# Patient Record
Sex: Female | Born: 1980
Health system: Southern US, Community
[De-identification: ages and names within clinical notes are randomized; demographics above are authoritative.]

## PROBLEM LIST (undated history)

## (undated) DIAGNOSIS — M26629 Arthralgia of temporomandibular joint, unspecified side: Secondary | ICD-10-CM

## (undated) DIAGNOSIS — M79606 Pain in leg, unspecified: Secondary | ICD-10-CM

## (undated) DIAGNOSIS — G8929 Other chronic pain: Secondary | ICD-10-CM

## (undated) DIAGNOSIS — I8390 Asymptomatic varicose veins of unspecified lower extremity: Secondary | ICD-10-CM

## (undated) HISTORY — DX: Asymptomatic varicose veins of unspecified lower extremity: I83.90

## (undated) HISTORY — PX: FINGER SURGERY: SHX640

## (undated) HISTORY — PX: TUBAL LIGATION: SHX77

---
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
## 2001-03-14 ENCOUNTER — Other Ambulatory Visit: Admission: RE | Admit: 2001-03-14 | Discharge: 2001-03-14 | Payer: Self-pay | Admitting: Family Medicine

## 2001-03-17 ENCOUNTER — Encounter: Payer: Self-pay | Admitting: Family Medicine

## 2001-03-17 ENCOUNTER — Inpatient Hospital Stay (HOSPITAL_COMMUNITY): Admission: RE | Admit: 2001-03-17 | Discharge: 2001-03-17 | Payer: Self-pay | Admitting: Family Medicine

## 2001-03-29 ENCOUNTER — Encounter (INDEPENDENT_AMBULATORY_CARE_PROVIDER_SITE_OTHER): Payer: Self-pay

## 2001-03-29 ENCOUNTER — Encounter: Payer: Self-pay | Admitting: Obstetrics

## 2001-03-29 ENCOUNTER — Ambulatory Visit (HOSPITAL_COMMUNITY): Admission: RE | Admit: 2001-03-29 | Discharge: 2001-03-29 | Payer: Self-pay | Admitting: Obstetrics

## 2001-07-20 ENCOUNTER — Encounter: Payer: Self-pay | Admitting: Family Medicine

## 2001-07-20 ENCOUNTER — Ambulatory Visit (HOSPITAL_COMMUNITY): Admission: RE | Admit: 2001-07-20 | Discharge: 2001-07-20 | Payer: Self-pay | Admitting: Family Medicine

## 2001-10-06 ENCOUNTER — Ambulatory Visit (HOSPITAL_COMMUNITY): Admission: RE | Admit: 2001-10-06 | Discharge: 2001-10-06 | Payer: Self-pay | Admitting: Family Medicine

## 2001-10-06 ENCOUNTER — Encounter: Payer: Self-pay | Admitting: Family Medicine

## 2001-11-15 ENCOUNTER — Ambulatory Visit (HOSPITAL_COMMUNITY): Admission: RE | Admit: 2001-11-15 | Discharge: 2001-11-15 | Payer: Self-pay | Admitting: Family Medicine

## 2001-11-15 ENCOUNTER — Encounter: Payer: Self-pay | Admitting: Family Medicine

## 2002-01-05 ENCOUNTER — Ambulatory Visit (HOSPITAL_COMMUNITY): Admission: RE | Admit: 2002-01-05 | Discharge: 2002-01-05 | Payer: Self-pay | Admitting: *Deleted

## 2002-01-05 ENCOUNTER — Encounter: Payer: Self-pay | Admitting: Family Medicine

## 2002-03-02 ENCOUNTER — Inpatient Hospital Stay (HOSPITAL_COMMUNITY): Admission: AD | Admit: 2002-03-02 | Discharge: 2002-03-04 | Payer: Self-pay | Admitting: Family Medicine

## 2002-04-17 ENCOUNTER — Other Ambulatory Visit: Admission: RE | Admit: 2002-04-17 | Discharge: 2002-04-17 | Payer: Self-pay | Admitting: Family Medicine

## 2002-04-23 ENCOUNTER — Inpatient Hospital Stay (HOSPITAL_COMMUNITY): Admission: AD | Admit: 2002-04-23 | Discharge: 2002-04-23 | Payer: Self-pay | Admitting: Family Medicine

## 2003-08-29 ENCOUNTER — Other Ambulatory Visit: Admission: RE | Admit: 2003-08-29 | Discharge: 2003-08-29 | Payer: Self-pay | Admitting: Family Medicine

## 2003-08-30 ENCOUNTER — Ambulatory Visit (HOSPITAL_COMMUNITY): Admission: RE | Admit: 2003-08-30 | Discharge: 2003-08-30 | Payer: Self-pay | Admitting: Family Medicine

## 2003-10-11 ENCOUNTER — Ambulatory Visit (HOSPITAL_COMMUNITY): Admission: RE | Admit: 2003-10-11 | Discharge: 2003-10-11 | Payer: Self-pay | Admitting: Family Medicine

## 2004-03-05 ENCOUNTER — Inpatient Hospital Stay (HOSPITAL_COMMUNITY): Admission: AD | Admit: 2004-03-05 | Discharge: 2004-03-06 | Payer: Self-pay | Admitting: Family Medicine

## 2004-04-17 ENCOUNTER — Ambulatory Visit (HOSPITAL_COMMUNITY): Admission: RE | Admit: 2004-04-17 | Discharge: 2004-04-17 | Payer: Self-pay | Admitting: Obstetrics & Gynecology

## 2004-04-17 ENCOUNTER — Ambulatory Visit: Payer: Self-pay | Admitting: Obstetrics & Gynecology

## 2004-04-28 ENCOUNTER — Ambulatory Visit: Payer: Self-pay | Admitting: Obstetrics & Gynecology

## 2007-12-20 ENCOUNTER — Emergency Department (HOSPITAL_COMMUNITY): Admission: EM | Admit: 2007-12-20 | Discharge: 2007-12-20 | Payer: Self-pay | Admitting: Emergency Medicine

## 2010-05-03 ENCOUNTER — Encounter: Payer: Self-pay | Admitting: Family Medicine

## 2010-08-28 NOTE — Op Note (Signed)
NAME:  Kristi Holmes, Kristi Holmes            ACCOUNT NO.:  [id number]   MEDICAL RECORD NO.:  [id number]          PATIENT TYPE:  AMB   LOCATION:  SDC                           FACILITY:  [facility]   PHYSICIAN:  Lesly Dukes, M.D. DATE OF BIRTH:  05-26-1980   DATE OF PROCEDURE:  04/17/2004  DATE OF DISCHARGE:                                 OPERATIVE REPORT   PREOPERATIVE DIAGNOSIS:  The patient is a 30 year old para 2-0-0-2 female  who presents for permanent sterilization.   POSTOPERATIVE DIAGNOSIS:  The patient is a 30 year old para 2-0-0-2 female  who presents for permanent sterilization.   PROCEDURE:  Laparoscopic bilateral tubal ligation with Filshie clip.   SURGEON:  Lesly Dukes, M.D.   ANESTHESIA:  General.   ESTIMATED BLOOD LOSS:  50 mL.   COMPLICATIONS:  None.   PATHOLOGY:  None.   FINDINGS:  Normal size uterus, normal fallopian tubes and ovaries grossly.   After informed consent was obtained, the patient was taken to the operating  room, where general anesthesia was induced.  The patient was placed in the  dorsal lithotomy position and prepared and draped in the normal sterile  fashion.  A bivalve speculum was placed into the patient's vagina and the  anterior lip of the cervix was grasped with a single-tooth tenaculum.  A  Hulka clip was then introduced into the cervix to aid in uterine  mobilization.  The bladder was in-and-out catheterized.  Gloves were changed  and an infraumbilical skin incision was made with a scalpel and carried down  to the underlying layer of fascia.  The fascia was incised in the  midline  and extended both superiorly and inferiorly.  The peritoneum was identified  and entered bluntly.  Retractors were placed in the abdomen and a blunt  trocar was placed into the incision.  A pneumoperitoneum was achieved with  CO2 and the laparoscope was introduced to the abdomen, where a survey of the  abdominal contents was performed.  In order to facilitate  getting the tubes  properly visualized, a suprapubic incision was made approximately 2 cm above  the pubic symphysis in the midline with a scalpel.  A 5 mm trocar was  introduced into the abdomen under direct visualization.  A blunt trocar was  then introduced into the abdomen and the fallopian tubes were brought into  view.  Filshie clips were placed on the right and left fallopian tube and  placement was confirmed.  There was no bleeding.  Pictures were taken  through the laparoscope.  The pneumoperitoneum was released and the trocars  removed from the abdomen.  The fascia was closed with 0 Vicryl.  The  infraumbilical skin incision was closed with 4-0 Vicryl in a  subcuticular fashion and the suprapubic incision was closed with Dermabond.  The patient tolerated the procedure well.  All instruments were removed from  the patient's vagina.  Sponge, lap, instrument, and needle count were  correct x2, and the patient went to the recovery room in stable condition.      KHL/MEDQ  D:  04/17/2004  T:  04/17/2004  Job:  [id number]

## 2010-08-28 NOTE — Op Note (Signed)
[facility]  Patient:    Kristi Holmes, Kristi Holmes Visit Number: [id number] MRN: [id number]          Service Type: DSU Location: [facility] Attending Physician:  Tammi Sou Dictated by:   Bing Neighbors Clearance Coots, M.D. Proc. Date: 03/29/01 Admit Date:  03/29/2001 Discharge Date: 03/29/2001                             Operative Report  PREOPERATIVE DIAGNOSES:       An 8 week fetal demise.  POSTOPERATIVE DIAGNOSES:      An 8 week fetal demise.  PROCEDURE:                    Suction dilatation and evacuation.  SURGEON:                      Charles A. Clearance Coots, M.D.  ANESTHESIA:                   MAC with paracervical block.  ESTIMATED BLOOD LOSS:         100 ml.  COMPLICATIONS:                None.  SPECIMEN:                     Products of conception.  OPERATION:                    Patient was brought to the operating room and after satisfactory IV sedation legs were brought up in stirrups and the vagina was prepped and draped in the usual sterile fashion.  The urinary bladder was emptied of approximately 50 ml of clear urine.  Bimanual examination revealed the uterus to be 8-9 weeks size, mid position.  Sterile speculum was inserted in the vagina and cervix was isolated.  The anterior lip of the cervix was grasped with a single tooth tenaculum.  The uterus was then sounded for access confirmation with uterine sound and cervix was dilated to #[facility] dilator. A #8 suction catheter was then easily introduced into the uterine cavity and all contents were evacuated.  The endometrial surface was then curetted with a small sharp curette and no further products of conception were obtained. There was no active bleeding at the conclusion of the procedure.  The uterus contracted down quite well.  All instruments were retired.  The patient tolerated the procedure well and was transported to the recovery room in satisfactory condition. Dictated by:    Bing Neighbors Clearance Coots, M.D. Attending Physician:  Tammi Sou DD:  04/07/01 TD:  04/07/01 Job: [id number]

## 2011-01-13 LAB — URINALYSIS, ROUTINE W REFLEX MICROSCOPIC
Bilirubin Urine: NEGATIVE
Specific Gravity, Urine: 1.035 — ABNORMAL HIGH
Urobilinogen, UA: 0.2

## 2011-01-13 LAB — POCT PREGNANCY, URINE: Preg Test, Ur: NEGATIVE

## 2011-01-13 LAB — WET PREP, GENITAL
Trich, Wet Prep: NONE SEEN
Yeast Wet Prep HPF POC: NONE SEEN

## 2011-05-17 ENCOUNTER — Emergency Department (HOSPITAL_COMMUNITY): Payer: BC Managed Care – PPO

## 2011-05-17 ENCOUNTER — Emergency Department (HOSPITAL_COMMUNITY)
Admission: EM | Admit: 2011-05-17 | Discharge: 2011-05-17 | Disposition: A | Discharge status: Home / Self Care | Payer: BC Managed Care – PPO | Attending: Emergency Medicine | Admitting: Emergency Medicine

## 2011-05-17 ENCOUNTER — Encounter (HOSPITAL_COMMUNITY): Payer: Self-pay

## 2011-05-17 DIAGNOSIS — S61209A Unspecified open wound of unspecified finger without damage to nail, initial encounter: Secondary | ICD-10-CM | POA: Insufficient documentation

## 2011-05-17 DIAGNOSIS — IMO0002 Reserved for concepts with insufficient information to code with codable children: Secondary | ICD-10-CM

## 2011-05-17 DIAGNOSIS — S62639B Displaced fracture of distal phalanx of unspecified finger, initial encounter for open fracture: Secondary | ICD-10-CM | POA: Insufficient documentation

## 2011-05-17 DIAGNOSIS — W230XXA Caught, crushed, jammed, or pinched between moving objects, initial encounter: Secondary | ICD-10-CM | POA: Insufficient documentation

## 2011-05-17 DIAGNOSIS — M79609 Pain in unspecified limb: Secondary | ICD-10-CM | POA: Insufficient documentation

## 2011-05-17 MED ORDER — CEFAZOLIN SODIUM 1-5 GM-% IV SOLN
1.0000 g | Freq: Once | INTRAVENOUS | Status: AC
  Administered 2011-05-17: 1 g via INTRAVENOUS
  Filled 2011-05-17: qty 50

## 2011-05-17 MED ORDER — OXYCODONE-ACETAMINOPHEN 5-325 MG PO TABS: 2.0000 | ORAL_TABLET | ORAL | Status: AC | PRN

## 2011-05-17 MED ORDER — HYDROMORPHONE HCL PF 1 MG/ML IJ SOLN
1.0000 mg | Freq: Once | INTRAMUSCULAR | Status: AC
  Administered 2011-05-17: 1 mg via INTRAVENOUS
  Filled 2011-05-17: qty 1

## 2011-05-17 MED ORDER — TETANUS-DIPHTH-ACELL PERTUSSIS 5-2.5-18.5 LF-MCG/0.5 IM SUSP
0.5000 mL | Freq: Once | INTRAMUSCULAR | Status: AC
  Administered 2011-05-17: 0.5 mL via INTRAMUSCULAR
  Filled 2011-05-17: qty 0.5

## 2011-05-17 MED ORDER — OXYCODONE-ACETAMINOPHEN 5-325 MG PO TABS
1.0000 | ORAL_TABLET | Freq: Once | ORAL | Status: AC
  Administered 2011-05-17: 1 via ORAL
  Filled 2011-05-17: qty 1

## 2011-05-17 NOTE — Progress Notes (Signed)
Orthopedic Tech Progress Note Patient Details:  Kristi Holmes [birth year] [id number]  Type of Splint: Finger Splint Location: (L) UE Splint Interventions: Application    Jennye Moccasin 05/17/2011, 6:42 PM

## 2011-05-17 NOTE — ED Notes (Signed)
Per PA wrapped pt's finger (laceration) with a saline dressing

## 2011-05-17 NOTE — ED Provider Notes (Signed)
History     CSN: [id number]  Arrival date & time 05/17/11  1420   None     Chief Complaint  Patient presents with  . Finger Injury    (Consider location/radiation/quality/duration/timing/severity/associated sxs/prior treatment) HPI History provided by pt.   Pt jammed her left middle finger between a log and the top of a wood-burning stove just pta.  C/o severe, non-radiating, throbbing pain.  No associated paresthesias.  She has not cleaned the wound.  Last tetanus unknown.    History reviewed. No pertinent past medical history.  History reviewed. No pertinent past surgical history.  History reviewed. No pertinent family history.  History  Substance Use Topics  . Smoking status: Never Smoker   . Smokeless tobacco: Not on file  . Alcohol Use: No    OB History    Grav Para Term Preterm Abortions TAB SAB Ect Mult Living                  Review of Systems  All other systems reviewed and are negative.    Allergies  Review of patient's allergies indicates no known allergies.  Home Medications   Current Outpatient Rx  Name Route Sig Dispense Refill  . OXYCODONE-ACETAMINOPHEN 5-325 MG PO TABS Oral Take 2 tablets by mouth every 4 (four) hours as needed for pain. 20 tablet 0    BP 128/81  Pulse 68  Temp(Src) 97.9 F (36.6 C) (Oral)  Resp 18  SpO2 99%  LMP 05/10/2011  Physical Exam  Nursing note and vitals reviewed. Constitutional: She is oriented to person, place, and time. She appears well-developed and well-nourished. No distress.  HENT:  Head: Normocephalic and atraumatic.  Eyes:       Normal appearance  Neck: Normal range of motion.  Musculoskeletal:       Left middle finger w/ 2.5cm horizontal laceration of anterior surface of distal phalanx, through the proximal nail bed. Oozing blood.  Distal sensation intact.    Neurological: She is alert and oriented to person, place, and time.  Psychiatric: She has a normal mood and affect. Her behavior is normal.     ED Course  Procedures (including critical care time)  LACERATION REPAIR Performed by: Otilio Miu Authorized by: Otilio Miu Consent: Verbal consent obtained. Risks and benefits: risks, benefits and alternatives were discussed Consent given by: patient Patient identity confirmed: provided demographic data Prepped and Draped in normal sterile fashion Wound explored  Laceration Location: distal phalanx left middle finger  Laceration Length: 2.5cm  No Foreign Bodies seen or palpated  Anesthesia: digital block  Local anesthetic: lidocaine 2% w/out epinephrine  Anesthetic total: 8 ml  (pt had to be injected twice because medication wore off)  Irrigation method: syringe Amount of cleaning: standard  Skin closure: Three 5.0 prolene and five 4.0 prolene  Technique: simple interrupted  Patient tolerance: Patient tolerated the procedure well with no immediate complications.  Labs Reviewed - No data to display Dg Finger Middle Left  05/17/2011  *RADIOLOGY REPORT*  Clinical Data: Injury.  Pain.  LEFT MIDDLE FINGER 2+V  Comparison: None.  Findings: There is a comminuted fracture of the tuft of the distal phalanx of the left third finger present.  There is a dressing overlying the fracture.  The tuft of the distal phalanx is displaced 4 mm in a volar direction. There is no significant angulation.  IMPRESSION: Comminuted displaced fracture of the tuft of the distal phalanx left third finger.  Original Report Authenticated By: Rolla Plate, M.D.  1. Open fracture of distal phalangeal tuft       MDM  Pt presents w/ open tuft fracture of left third finger.  Wound cleaned and sutured.  Pt received 1g IV Ancef and a tetanus.  Dr. Mina Marble consulted and he will f/u with patient this week.   Pt d/c'd home w/ prescription for pain medication.  Flow manager called in her prescription for keflex Tuesday morning and pt notified.        Arie Sabina  [place], [place] 05/18/11 1128

## 2011-05-17 NOTE — ED Notes (Signed)
Smashed middle finger on left hand loading wood stove, occure dpta, pms intact to tip of finger. Lac ntoed.

## 2011-05-18 NOTE — ED Notes (Signed)
Ruby Cola PA called and asked FM to notify pt Abx Rx for "Keflex 500 mg tab, 1 tab 4 times/day x 7 days" not given 05/17/11.  Pt notified of need for Abx Rx and Rx called to [facility].

## 2011-05-20 NOTE — ED Provider Notes (Signed)
Medical screening examination/treatment/procedure(s) were performed by non-physician practitioner and as supervising physician I was immediately available for consultation/collaboration.   Celene Kras, MD 05/20/11 [phone]

## 2013-05-01 ENCOUNTER — Other Ambulatory Visit: Payer: Self-pay | Admitting: General Practice

## 2013-05-14 ENCOUNTER — Encounter (INDEPENDENT_AMBULATORY_CARE_PROVIDER_SITE_OTHER): Payer: Self-pay

## 2013-05-14 ENCOUNTER — Encounter: Payer: Self-pay | Admitting: General Practice

## 2013-05-14 ENCOUNTER — Ambulatory Visit (INDEPENDENT_AMBULATORY_CARE_PROVIDER_SITE_OTHER): Payer: BC Managed Care – PPO | Admitting: General Practice

## 2013-05-14 VITALS — BP 139/90 | HR 77 | Temp 98.1°F | Ht 69.0 in | Wt 148.0 lb

## 2013-05-14 DIAGNOSIS — Z01419 Encounter for gynecological examination (general) (routine) without abnormal findings: Secondary | ICD-10-CM

## 2013-05-14 DIAGNOSIS — Z124 Encounter for screening for malignant neoplasm of cervix: Secondary | ICD-10-CM

## 2013-05-14 DIAGNOSIS — R634 Abnormal weight loss: Secondary | ICD-10-CM

## 2013-05-14 DIAGNOSIS — R5381 Other malaise: Secondary | ICD-10-CM

## 2013-05-14 DIAGNOSIS — S0300XA Dislocation of jaw, unspecified side, initial encounter: Secondary | ICD-10-CM | POA: Insufficient documentation

## 2013-05-14 DIAGNOSIS — R5383 Other fatigue: Secondary | ICD-10-CM

## 2013-05-14 LAB — POCT UA - MICROSCOPIC ONLY
CASTS, UR, LPF, POC: NEGATIVE
Crystals, Ur, HPF, POC: NEGATIVE
Mucus, UA: NEGATIVE
YEAST UA: NEGATIVE

## 2013-05-14 LAB — POCT URINALYSIS DIPSTICK
BILIRUBIN UA: NEGATIVE
Glucose, UA: NEGATIVE
KETONES UA: NEGATIVE
Leukocytes, UA: NEGATIVE
Nitrite, UA: NEGATIVE
PH UA: 7
RBC UA: NEGATIVE
Spec Grav, UA: 1.01
Urobilinogen, UA: NEGATIVE

## 2013-05-14 MED ORDER — PREDNISONE 20 MG PO TABS: 40.0000 mg | ORAL_TABLET | Freq: Every day | ORAL | Status: DC

## 2013-05-14 MED ORDER — PREDNISONE (PAK) 10 MG PO TABS: ORAL_TABLET | ORAL | Status: DC

## 2013-05-14 MED ORDER — METHYLPREDNISOLONE ACETATE 80 MG/ML IJ SUSP
80.0000 mg | Freq: Once | INTRAMUSCULAR | Status: AC
  Administered 2013-05-14: 80 mg via INTRAMUSCULAR

## 2013-05-14 NOTE — Patient Instructions (Signed)
Temporomandibular Problems  Temporomandibular joint (TMJ) dysfunction means there are problems with the joint between your jaw and your skull. This is a joint lined by cartilage like other joints in your body but also has a small disc in the joint which keeps the bones from rubbing on each other. These joints are like other joints and can get inflamed (sore) from arthritis and other problems. When this joint gets sore, it can cause headaches and pain in the jaw and the face. CAUSES  Usually the arthritic types of problems are caused by soreness in the joint. Soreness in the joint can also be caused by overuse. This may come from grinding your teeth. It may also come from mis-alignment in the joint. DIAGNOSIS Diagnosis of this condition can often be made by history and exam. Sometimes your caregiver may need X-rays or an MRI scan to determine the exact cause. It may be necessary to see your dentist to determine if your teeth and jaws are lined up correctly. TREATMENT  Most of the time this problem is not serious; however, sometimes it can persist (become chronic). When this happens medications that will cut down on inflammation (soreness) help. Sometimes a shot of cortisone into the joint will be helpful. If your teeth are not aligned it may help for your dentist to make a splint for your mouth that can help this problem. If no physical problems can be found, the problem may come from tension. If tension is found to be the cause, biofeedback or relaxation techniques may be helpful. HOME CARE INSTRUCTIONS   Later in the day, applications of ice packs may be helpful. Ice can be used in a plastic bag with a towel around it to prevent frostbite to skin. This may be used about every 2 hours for 20 to 30 minutes, as needed while awake, or as directed by your caregiver.  Only take over-the-counter or prescription medicines for pain, discomfort, or fever as directed by your caregiver.  If physical therapy was  prescribed, follow your caregiver's directions.  Wear mouth appliances as directed if they were given. Document Released: 12/22/2000 Document Revised: 06/21/2011 Document Reviewed: 03/31/2008 [employer] Patient Information 2014 [employer].  

## 2013-05-14 NOTE — Progress Notes (Signed)
Subjective:    Patient ID: Kristi Holmes, female    DOB: 11/28/1980, 33 y.o.   MRN: [id number]  HPI Patient presents today for GYN exam. She complains of TMJ pain, weight loss, and feeling fatigued. She reports being seen by her dentist and he suggested an ENT, but she is unable to afford visiting a specialist. Patient has flexeril that she is reportedly taking at night to help with TMJ pain. Also taking ibuprofen OTC. Reports minimal relief. Has taken prednisone in the past and significant relief provided.     Review of Systems  Constitutional: Positive for fatigue. Negative for fever and chills.  HENT:       Jaw pain  Respiratory: Negative for chest tightness, shortness of breath and wheezing.   Cardiovascular: Negative for chest pain and palpitations.  Gastrointestinal: Negative for nausea, vomiting, abdominal pain, diarrhea, constipation and blood in stool.  Genitourinary: Negative for dysuria and difficulty urinating.  Neurological: Negative for dizziness, weakness and headaches.       Objective:   Physical Exam  Constitutional: She is oriented to person, place, and time. She appears well-developed and well-nourished.  HENT:  Head: Normocephalic.  Right Ear: External ear normal.  Left Ear: External ear normal.  Mouth/Throat: Oropharynx is clear and moist.  Mandibular pain with opening and closing mouth  Eyes: Conjunctivae and EOM are normal. Pupils are equal, round, and reactive to light.  Neck: Normal range of motion. Neck supple. No thyromegaly present.  Cardiovascular: Normal rate, regular rhythm, normal heart sounds and intact distal pulses.   Pulmonary/Chest: Effort normal and breath sounds normal. No respiratory distress. She exhibits no tenderness. Right breast exhibits no inverted nipple, no mass, no nipple discharge, no skin change and no tenderness. Left breast exhibits no inverted nipple, no mass, no nipple discharge, no skin change and no tenderness. Breasts  are symmetrical.  Abdominal: Soft. Bowel sounds are normal. She exhibits no distension.  Genitourinary: Vagina normal and uterus normal. No breast swelling, tenderness, discharge or bleeding. No labial fusion. There is no rash, tenderness, lesion or injury on the right labia. There is no rash, tenderness, lesion or injury on the left labia. Uterus is not deviated, not enlarged, not fixed and not tender. Cervix exhibits no motion tenderness, no discharge and no friability. Right adnexum displays no mass, no tenderness and no fullness. Left adnexum displays no mass, no tenderness and no fullness. No erythema, tenderness or bleeding around the vagina. No foreign body around the vagina. No signs of injury around the vagina. No vaginal discharge found.  Lymphadenopathy:    She has no cervical adenopathy.  Neurological: She is alert and oriented to person, place, and time.  Skin: Skin is warm and dry.  Psychiatric: She has a normal mood and affect.          Assessment & Plan:  1. Encounter for routine gynecological examination  - POCT urinalysis dipstick - POCT UA - Microscopic Only - Pap IG w/ reflex to HPV when ASC-U  2. Fatigue  - POCT CBC - CMP14+EGFR  3. Loss of weight  - Thyroid Panel With TSH  4. TMJ (dislocation of temporomandibular joint)  - methylPREDNISolone acetate (DEPO-MEDROL) injection 80 mg; Inject 1 mL (80 mg total) into the muscle once. - predniSONE (STERAPRED UNI-PAK) 10 MG tablet; Take as directed  Dispense: 21 tablet; Refill: 0 -ice affected area 15-20 minutes increments -patient declined naproxen at this time -declined ENT referral at this time -RTO prn Patient verbalized understanding  Marieanne Marxen E. Dallas Scorsone, FNP-C

## 2013-05-15 ENCOUNTER — Other Ambulatory Visit: Payer: Self-pay | Admitting: General Practice

## 2013-05-15 DIAGNOSIS — S0300XA Dislocation of jaw, unspecified side, initial encounter: Secondary | ICD-10-CM

## 2013-05-15 LAB — CMP14+EGFR
ALK PHOS: 35 IU/L — AB (ref 39–117)
ALT: 9 IU/L (ref 0–32)
AST: 12 IU/L (ref 0–40)
Albumin/Globulin Ratio: 2.5 (ref 1.1–2.5)
Albumin: 4.2 g/dL (ref 3.5–5.5)
BUN / CREAT RATIO: 17 (ref 8–20)
BUN: 12 mg/dL (ref 6–20)
CALCIUM: 8.8 mg/dL (ref 8.7–10.2)
CHLORIDE: 101 mmol/L (ref 97–108)
CO2: 24 mmol/L (ref 18–29)
Creatinine, Ser: 0.69 mg/dL (ref 0.57–1.00)
GFR calc Af Amer: 133 mL/min/{1.73_m2} (ref >59)
GFR calc non Af Amer: 116 mL/min/{1.73_m2} (ref >59)
Globulin, Total: 1.7 g/dL (ref 1.5–4.5)
Glucose: 93 mg/dL (ref 65–99)
Potassium: 4.2 mmol/L (ref 3.5–5.2)
SODIUM: 140 mmol/L (ref 134–144)
TOTAL PROTEIN: 5.9 g/dL — AB (ref 6.0–8.5)
Total Bilirubin: <0.2 mg/dL (ref 0.0–1.2)

## 2013-05-15 LAB — PAP IG W/ RFLX HPV ASCU: PAP SMEAR COMMENT: 0

## 2013-05-15 LAB — THYROID PANEL WITH TSH
FREE THYROXINE INDEX: 1.8 (ref 1.2–4.9)
T3 UPTAKE RATIO: 35 % (ref 24–39)
T4, Total: 5.1 ug/dL (ref 4.5–12.0)
TSH: 0.992 u[IU]/mL (ref 0.450–4.500)

## 2013-05-22 ENCOUNTER — Telehealth: Payer: Self-pay | Admitting: *Deleted

## 2013-05-22 NOTE — Telephone Encounter (Signed)
Message copied by Tamera PuntWRAY, Heavyn Yearsley S on Tue May 22, 2013 11:29 AM ------      Message from: Philomena DohenyHALIBURTON, MAE E      Created: Mon May 21, 2013  8:21 AM       Please inform labs wnl. ------

## 2013-07-02 ENCOUNTER — Ambulatory Visit (INDEPENDENT_AMBULATORY_CARE_PROVIDER_SITE_OTHER): Payer: BC Managed Care – PPO | Admitting: Nurse Practitioner

## 2013-07-02 ENCOUNTER — Encounter: Payer: Self-pay | Admitting: Nurse Practitioner

## 2013-07-02 VITALS — BP 114/61 | HR 60 | Temp 97.8°F | Ht 69.0 in | Wt 143.0 lb

## 2013-07-02 DIAGNOSIS — F411 Generalized anxiety disorder: Secondary | ICD-10-CM

## 2013-07-02 DIAGNOSIS — S0300XA Dislocation of jaw, unspecified side, initial encounter: Secondary | ICD-10-CM

## 2013-07-02 MED ORDER — CITALOPRAM HYDROBROMIDE 20 MG PO TABS: 20.0000 mg | ORAL_TABLET | Freq: Every day | ORAL | Status: DC

## 2013-07-02 MED ORDER — DIAZEPAM 10 MG PO TABS: 10.0000 mg | ORAL_TABLET | Freq: Four times a day (QID) | ORAL | Status: DC | PRN

## 2013-07-02 NOTE — Patient Instructions (Signed)
Temporomandibular Problems  Temporomandibular joint (TMJ) dysfunction means there are problems with the joint between your jaw and your skull. This is a joint lined by cartilage like other joints in your body but also has a small disc in the joint which keeps the bones from rubbing on each other. These joints are like other joints and can get inflamed (sore) from arthritis and other problems. When this joint gets sore, it can cause headaches and pain in the jaw and the face. CAUSES  Usually the arthritic types of problems are caused by soreness in the joint. Soreness in the joint can also be caused by overuse. This may come from grinding your teeth. It may also come from mis-alignment in the joint. DIAGNOSIS Diagnosis of this condition can often be made by history and exam. Sometimes your caregiver may need X-rays or an MRI scan to determine the exact cause. It may be necessary to see your dentist to determine if your teeth and jaws are lined up correctly. TREATMENT  Most of the time this problem is not serious; however, sometimes it can persist (become chronic). When this happens medications that will cut down on inflammation (soreness) help. Sometimes a shot of cortisone into the joint will be helpful. If your teeth are not aligned it may help for your dentist to make a splint for your mouth that can help this problem. If no physical problems can be found, the problem may come from tension. If tension is found to be the cause, biofeedback or relaxation techniques may be helpful. HOME CARE INSTRUCTIONS   Later in the day, applications of ice packs may be helpful. Ice can be used in a plastic bag with a towel around it to prevent frostbite to skin. This may be used about every 2 hours for 20 to 30 minutes, as needed while awake, or as directed by your caregiver.  Only take over-the-counter or prescription medicines for pain, discomfort, or fever as directed by your caregiver.  If physical therapy was  prescribed, follow your caregiver's directions.  Wear mouth appliances as directed if they were given. Document Released: 12/22/2000 Document Revised: 06/21/2011 Document Reviewed: 03/31/2008 [employer] Patient Information 2014 [employer].  

## 2013-07-02 NOTE — Progress Notes (Signed)
   Subjective:    Patient ID: Kristi NicelySamantha Y Holmes, female    DOB: 03/30/1981, 33 y.o.   MRN: [id number]  HPI Patient was seen by M.Haliburton over a month ago and she was not pleased with her care- SHe has the following complaints; -TMJ worsening- really clenching her jaws tightly at night and it keeps her up at night and makes her jaws ache all day- She is currently only taking ibuprofen which doesn't help. Steroid shot shelp temporarily. - GAD- not on anything- lots going on in personal life    Review of Systems  Constitutional: Negative.   HENT: Negative.   Respiratory: Negative.   Cardiovascular: Negative.   Gastrointestinal: Negative.   Psychiatric/Behavioral: Negative.   All other systems reviewed and are negative.       Objective:   Physical Exam  Constitutional: She is oriented to person, place, and time. She appears well-developed and well-nourished.  HENT:  Right Ear: External ear normal.  Left Ear: External ear normal.  Nose: Nose normal.  Mouth/Throat: Oropharynx is clear and moist.  Limited ROM of jaw due to pain- cannot fully open her mouth.  Cardiovascular: Normal rate, regular rhythm and normal heart sounds.   Pulmonary/Chest: Effort normal and breath sounds normal.  Neurological: She is alert and oriented to person, place, and time.  Skin: Skin is warm and dry.  Psychiatric: She has a normal mood and affect. Her behavior is normal. Judgment and thought content normal.   BP 114/61  Pulse 60  Temp(Src) 97.8 F (36.6 C) (Oral)  Ht 5\' 9"  (1.753 m)  Wt 143 lb (64.864 kg)  BMI 21.11 kg/m2        Assessment & Plan:  1. TMJ (dislocation of temporomandibular joint) Mouth guard - diazepam (VALIUM) 10 MG tablet; Take 1 tablet (10 mg total) by mouth every 6 (six) hours as needed for anxiety.  Dispense: 30 tablet; Refill: 1  2. GAD (generalized anxiety disorder) Stress management Side effects discussed Follow up in 1 month - citalopram (CELEXA) 20 MG  tablet; Take 1 tablet (20 mg total) by mouth daily.  Dispense: 30 tablet; Refill: 3  Mary-Margaret Daphine DeutscherMartin, FNP

## 2013-07-10 ENCOUNTER — Telehealth: Payer: Self-pay | Admitting: Nurse Practitioner

## 2013-07-11 NOTE — Telephone Encounter (Signed)
Patient aware has appointment already set up

## 2013-07-11 NOTE — Telephone Encounter (Signed)
NTBS for this.

## 2013-08-01 ENCOUNTER — Encounter: Payer: Self-pay | Admitting: Nurse Practitioner

## 2013-08-01 ENCOUNTER — Ambulatory Visit (INDEPENDENT_AMBULATORY_CARE_PROVIDER_SITE_OTHER): Payer: BC Managed Care – PPO | Admitting: Nurse Practitioner

## 2013-08-01 VITALS — BP 132/84 | HR 72 | Temp 98.1°F | Ht 69.0 in | Wt 142.0 lb

## 2013-08-01 DIAGNOSIS — S0300XA Dislocation of jaw, unspecified side, initial encounter: Secondary | ICD-10-CM

## 2013-08-01 NOTE — Patient Instructions (Signed)
Temporomandibular Problems  Temporomandibular joint (TMJ) dysfunction means there are problems with the joint between your jaw and your skull. This is a joint lined by cartilage like other joints in your body but also has a small disc in the joint which keeps the bones from rubbing on each other. These joints are like other joints and can get inflamed (sore) from arthritis and other problems. When this joint gets sore, it can cause headaches and pain in the jaw and the face. CAUSES  Usually the arthritic types of problems are caused by soreness in the joint. Soreness in the joint can also be caused by overuse. This may come from grinding your teeth. It may also come from mis-alignment in the joint. DIAGNOSIS Diagnosis of this condition can often be made by history and exam. Sometimes your caregiver may need X-rays or an MRI scan to determine the exact cause. It may be necessary to see your dentist to determine if your teeth and jaws are lined up correctly. TREATMENT  Most of the time this problem is not serious; however, sometimes it can persist (become chronic). When this happens medications that will cut down on inflammation (soreness) help. Sometimes a shot of cortisone into the joint will be helpful. If your teeth are not aligned it may help for your dentist to make a splint for your mouth that can help this problem. If no physical problems can be found, the problem may come from tension. If tension is found to be the cause, biofeedback or relaxation techniques may be helpful. HOME CARE INSTRUCTIONS   Later in the day, applications of ice packs may be helpful. Ice can be used in a plastic bag with a towel around it to prevent frostbite to skin. This may be used about every 2 hours for 20 to 30 minutes, as needed while awake, or as directed by your caregiver.  Only take over-the-counter or prescription medicines for pain, discomfort, or fever as directed by your caregiver.  If physical therapy was  prescribed, follow your caregiver's directions.  Wear mouth appliances as directed if they were given. Document Released: 12/22/2000 Document Revised: 06/21/2011 Document Reviewed: 03/31/2008 [employer] Patient Information 2014 [employer].  

## 2013-08-01 NOTE — Progress Notes (Signed)
   Subjective:    Patient ID: Blenda NicelySamantha Y Radcliffe, female    DOB: 1981-02-04, 33 y.o.   MRN: [id number]  HPI Patient was seen March 23,2015 with painful TMJ- was given valium to take at night and use mouth guard at night. She can tell that it is helping but she still has some pain- Chewing some foods don't bother her as much and she asa more ROM. Would still like to see oral surgeon to discuss options.    Review of Systems  Constitutional: Negative.   HENT: Negative.   Respiratory: Negative.   Cardiovascular: Negative.   All other systems reviewed and are negative.      Objective:   Physical Exam  Constitutional: She is oriented to person, place, and time. She appears well-developed and well-nourished.  HENT:  bil jaw popping with opening and closing of mouth- limited rom of jaw due to pain.  Cardiovascular: Normal rate, regular rhythm and normal heart sounds.   Pulmonary/Chest: Effort normal and breath sounds normal.  Neurological: She is alert and oriented to person, place, and time.  Skin: Skin is warm and dry.   BP 132/84  Pulse 72  Temp(Src) 98.1 F (36.7 C) (Oral)  Ht 5\' 9"  (1.753 m)  Wt 142 lb (64.411 kg)  BMI 20.96 kg/m2        Assessment & Plan:  1. TMJ (dislocation of temporomandibular joint) Continue to eat foods that you do not have to chew a lot Continue valium Follow up prn - Ambulatory referral to Oral Maxillofacial Surgery  Mary-Margaret Daphine DeutscherMartin, FNP

## 2013-08-29 ENCOUNTER — Other Ambulatory Visit: Payer: Self-pay | Admitting: Nurse Practitioner

## 2013-08-29 ENCOUNTER — Telehealth: Payer: Self-pay | Admitting: Nurse Practitioner

## 2013-08-29 DIAGNOSIS — S0300XA Dislocation of jaw, unspecified side, initial encounter: Secondary | ICD-10-CM

## 2013-08-29 MED ORDER — DIAZEPAM 10 MG PO TABS: 10.0000 mg | ORAL_TABLET | Freq: Four times a day (QID) | ORAL | Status: DC | PRN

## 2013-08-30 NOTE — Telephone Encounter (Signed)
Patient pick up rx yesterday

## 2013-09-24 ENCOUNTER — Telehealth: Payer: Self-pay | Admitting: Nurse Practitioner

## 2013-09-24 DIAGNOSIS — S0300XA Dislocation of jaw, unspecified side, initial encounter: Secondary | ICD-10-CM

## 2013-09-24 MED ORDER — DIAZEPAM 10 MG PO TABS: 10.0000 mg | ORAL_TABLET | Freq: Four times a day (QID) | ORAL | Status: DC | PRN

## 2013-09-24 NOTE — Telephone Encounter (Signed)
rx ready for pickup 

## 2013-09-24 NOTE — Telephone Encounter (Signed)
Pt notified that rx up front and ready to be picked up

## 2013-10-04 ENCOUNTER — Other Ambulatory Visit: Payer: Self-pay | Admitting: Nurse Practitioner

## 2013-10-04 MED ORDER — TRAMADOL HCL 50 MG PO TABS: 50.0000 mg | ORAL_TABLET | Freq: Four times a day (QID) | ORAL | Status: DC | PRN

## 2013-10-30 ENCOUNTER — Telehealth: Payer: Self-pay | Admitting: Nurse Practitioner

## 2013-10-30 DIAGNOSIS — S0300XS Dislocation of jaw, unspecified side, sequela: Secondary | ICD-10-CM

## 2013-10-30 MED ORDER — DIAZEPAM 10 MG PO TABS: 10.0000 mg | ORAL_TABLET | Freq: Every evening | ORAL | Status: DC | PRN

## 2013-10-30 NOTE — Telephone Encounter (Signed)
Please call in valium 10 mg  1 PO qhs #30 with 0 refills Have not decided what to do about pain meds

## 2013-10-30 NOTE — Telephone Encounter (Signed)
Called in valium and patient aware

## 2013-11-02 ENCOUNTER — Telehealth: Payer: Self-pay | Admitting: Nurse Practitioner

## 2013-11-05 ENCOUNTER — Other Ambulatory Visit: Payer: Self-pay | Admitting: Nurse Practitioner

## 2013-11-05 MED ORDER — TRAMADOL HCL 50 MG PO TABS: 50.0000 mg | ORAL_TABLET | Freq: Four times a day (QID) | ORAL | Status: DC | PRN

## 2013-11-05 NOTE — Telephone Encounter (Signed)
I have discussed wiuth pain management here at office and we are not going o rx that strong of a medication for TMJ. The addictive component is to strong for someone her age. All we can do is valium and or ultram and she will need to see oral surgeon.

## 2013-11-05 NOTE — Telephone Encounter (Signed)
Patient says that is fine and she will take what she can get until she can get an appt with the oral surgeon. Can a rx be done and a referral made. Please advise

## 2013-11-06 ENCOUNTER — Telehealth: Payer: Self-pay | Admitting: Family Medicine

## 2013-11-06 NOTE — Telephone Encounter (Signed)
Patient aware up front to be picked up

## 2013-11-21 ENCOUNTER — Telehealth: Payer: Self-pay | Admitting: Nurse Practitioner

## 2013-11-21 MED ORDER — TRAMADOL HCL 50 MG PO TABS: 50.0000 mg | ORAL_TABLET | Freq: Four times a day (QID) | ORAL | Status: DC | PRN

## 2013-11-21 NOTE — Telephone Encounter (Signed)
rx ready for pick up No more refills for awhile- just had filled on July 27- taking to frequently

## 2013-11-21 NOTE — Telephone Encounter (Signed)
Not due for refill until 11/30/13

## 2013-11-21 NOTE — Telephone Encounter (Signed)
This was prescribed for every 6 hrs PRN #40. This ended up being a 10 day supply for her. She has been out for 2 days.  Do you want to change the directions?

## 2013-11-22 NOTE — Telephone Encounter (Signed)
Up front to pick up 

## 2013-11-28 ENCOUNTER — Telehealth: Payer: Self-pay | Admitting: Nurse Practitioner

## 2013-11-28 DIAGNOSIS — S0300XS Dislocation of jaw, unspecified side, sequela: Secondary | ICD-10-CM

## 2013-11-28 MED ORDER — DIAZEPAM 10 MG PO TABS: 10.0000 mg | ORAL_TABLET | Freq: Every evening | ORAL | Status: DC | PRN

## 2013-11-28 NOTE — Telephone Encounter (Signed)
rx ready for pick up Do Not Call In

## 2013-11-28 NOTE — Telephone Encounter (Signed)
Patient aware is ready to be picked up.  

## 2013-12-03 ENCOUNTER — Telehealth: Payer: Self-pay | Admitting: Nurse Practitioner

## 2013-12-03 MED ORDER — TRAMADOL HCL 50 MG PO TABS: ORAL_TABLET | ORAL | Status: DC

## 2013-12-03 NOTE — Telephone Encounter (Signed)
Last refilled on 11/21/13. She thinks this is a 10 day supply.  Do you want to change the directions so she doesn't think she should take it 4 times a day?

## 2013-12-03 NOTE — Telephone Encounter (Signed)
Do not need to take 4 x a day- should only take if-  Needed rx ready for pick up- note change in directions

## 2013-12-04 NOTE — Telephone Encounter (Signed)
Pt notified about directions change and sent over to discuss referral with debbie for oral surgeon.

## 2014-01-02 ENCOUNTER — Other Ambulatory Visit (HOSPITAL_COMMUNITY): Payer: Self-pay | Admitting: Oral Surgery

## 2014-01-02 ENCOUNTER — Telehealth: Payer: Self-pay | Admitting: Nurse Practitioner

## 2014-01-02 DIAGNOSIS — M26609 Unspecified temporomandibular joint disorder, unspecified side: Secondary | ICD-10-CM

## 2014-01-02 DIAGNOSIS — S0300XS Dislocation of jaw, unspecified side, sequela: Secondary | ICD-10-CM

## 2014-01-02 MED ORDER — DIAZEPAM 10 MG PO TABS: 10.0000 mg | ORAL_TABLET | Freq: Every evening | ORAL | Status: DC | PRN

## 2014-01-02 MED ORDER — TRAMADOL HCL 50 MG PO TABS
ORAL_TABLET | ORAL | Status: DC
Start: 2014-01-02 — End: 2014-01-30

## 2014-01-02 NOTE — Telephone Encounter (Signed)
rx ready for pickup 

## 2014-01-03 NOTE — Telephone Encounter (Signed)
Patient aware to pick up 

## 2014-01-11 ENCOUNTER — Ambulatory Visit (HOSPITAL_COMMUNITY): Admission: RE | Admit: 2014-01-11 | Payer: Medicaid Other | Source: Ambulatory Visit

## 2014-01-21 ENCOUNTER — Ambulatory Visit (HOSPITAL_COMMUNITY)
Admission: RE | Admit: 2014-01-21 | Discharge: 2014-01-21 | Disposition: A | Discharge status: Home / Self Care | Payer: Medicaid Other | Source: Ambulatory Visit | Attending: Oral Surgery | Admitting: Oral Surgery

## 2014-01-21 DIAGNOSIS — M26609 Unspecified temporomandibular joint disorder, unspecified side: Secondary | ICD-10-CM

## 2014-01-21 DIAGNOSIS — M2660 Temporomandibular joint disorder, unspecified: Secondary | ICD-10-CM | POA: Diagnosis present

## 2014-01-29 ENCOUNTER — Telehealth: Payer: Self-pay | Admitting: Nurse Practitioner

## 2014-01-29 DIAGNOSIS — S0300XS Dislocation of jaw, unspecified side, sequela: Secondary | ICD-10-CM

## 2014-01-30 MED ORDER — DIAZEPAM 10 MG PO TABS: 10.0000 mg | ORAL_TABLET | Freq: Every evening | ORAL | Status: DC | PRN

## 2014-01-30 MED ORDER — TRAMADOL HCL 50 MG PO TABS: ORAL_TABLET | ORAL | Status: DC

## 2014-01-30 NOTE — Telephone Encounter (Signed)
Aware, scripts for Valium and Tramadol ready.

## 2014-01-30 NOTE — Telephone Encounter (Signed)
Pt notified to pickup RX. 

## 2014-01-30 NOTE — Telephone Encounter (Signed)
rx ready for pickup 

## 2014-02-14 ENCOUNTER — Telehealth: Payer: Self-pay | Admitting: Nurse Practitioner

## 2014-02-14 NOTE — Telephone Encounter (Signed)
Has to get pain meds from surgeon

## 2014-02-14 NOTE — Telephone Encounter (Signed)
Pt is waiting on surgeon to call her back. She saw him on Tuesday, but woke up several times last night in worse pain. Will call us back if needed, explained that she would have to be seen.

## 2014-02-21 ENCOUNTER — Ambulatory Visit (INDEPENDENT_AMBULATORY_CARE_PROVIDER_SITE_OTHER): Payer: Medicaid Other | Admitting: Family Medicine

## 2014-02-21 ENCOUNTER — Telehealth: Payer: Self-pay | Admitting: Nurse Practitioner

## 2014-02-21 VITALS — BP 136/84 | HR 83 | Temp 97.3°F | Ht 69.0 in | Wt 139.4 lb

## 2014-02-21 DIAGNOSIS — S0300XA Dislocation of jaw, unspecified side, initial encounter: Secondary | ICD-10-CM

## 2014-02-21 DIAGNOSIS — S030XXA Dislocation of jaw, initial encounter: Secondary | ICD-10-CM

## 2014-02-21 MED ORDER — HYDROCODONE-ACETAMINOPHEN 10-325 MG PO TABS: 1.0000 | ORAL_TABLET | Freq: Four times a day (QID) | ORAL | Status: DC | PRN

## 2014-02-21 NOTE — Telephone Encounter (Signed)
Left detailed message on patients voicemail that Kristi Holmes has not appts available today or tomorrow. Will send back and see if meds can be called in

## 2014-02-21 NOTE — Progress Notes (Signed)
   Subjective:    Patient ID: Kristi Holmes, female    DOB: 12-16-1980, 33 y.o.   MRN: [id number]  HPI Patient c/o severe TMJ pain bilateral for over a week.  She states she was seeing her ENT specialist who was prescribing her pain meds and she states he refused to rx and wanted her to see her PCP.  She has had surgery and she is awaiting PT.  Review of Systems    No chest pain, SOB, HA, dizziness, vision change, N/V, diarrhea, constipation, dysuria, urinary urgency or frequency, myalgias, arthralgias or rash.  Objective:    BP 136/84 mmHg  Pulse 83  Temp(Src) 97.3 F (36.3 C)  Ht 5\' 9"  (1.753 m)  Wt 139 lb 6.4 oz (63.231 kg)  BMI 20.58 kg/m2 Physical Exam  TTP bilateral TMJ      Assessment & Plan:     ICD-9-CM ICD-10-CM   1. TMJ (dislocation of temporomandibular joint), initial encounter 830.0 S03.0XXA HYDROcodone-acetaminophen (NORCO) 10-325 MG per tablet     No Follow-up on file.  Deatra CanterWilliam J Starnisha Batrez FNP

## 2014-02-23 ENCOUNTER — Other Ambulatory Visit: Payer: Self-pay | Admitting: Nurse Practitioner

## 2014-02-25 ENCOUNTER — Other Ambulatory Visit: Payer: Self-pay | Admitting: Nurse Practitioner

## 2014-02-25 DIAGNOSIS — S0300XS Dislocation of jaw, unspecified side, sequela: Secondary | ICD-10-CM

## 2014-02-25 MED ORDER — DIAZEPAM 10 MG PO TABS: 10.0000 mg | ORAL_TABLET | Freq: Every evening | ORAL | Status: DC | PRN

## 2014-02-25 NOTE — Telephone Encounter (Signed)
Patient will have to pick up valium rx- cannot fill till 03/01/14 Will not fill ultram because [facility]- will no longer do both

## 2014-02-25 NOTE — Telephone Encounter (Signed)
Aware. 

## 2014-03-01 ENCOUNTER — Other Ambulatory Visit: Payer: Self-pay | Admitting: Nurse Practitioner

## 2014-03-01 DIAGNOSIS — S0300XD Dislocation of jaw, unspecified side, subsequent encounter: Secondary | ICD-10-CM

## 2014-03-01 NOTE — Telephone Encounter (Signed)
Referral made to pain management 

## 2014-03-01 NOTE — Telephone Encounter (Signed)
Advised pt we couldn't refill hydrocodone at this time, pt was upset and states she is in awful pain, she needs something. Advised pt we could do a pain management referral if she wanted. The pt said to do it and then the line disconnected.

## 2014-03-01 NOTE — Telephone Encounter (Signed)
Hydrocodone denied- just had filled on 02/21/14- if going to continue on this will have to be referred to pain management

## 2014-03-01 NOTE — Telephone Encounter (Signed)
I spoke with Annette StableBill and he says no that he will not refill hydrocodone that she just got some on 11/12 and that he agrees that she should be referred to pain management

## 2014-03-01 NOTE — Telephone Encounter (Signed)
Pt ask MMM to refill hydrocodone that [facility] MinorBill Oxford prescribed her on las visit for jaw pain.  MMM is not going to refill.  Pt wants to know if Ander SladeBill Oxford will refill hydrocodone.  Call Kingston[place]antha at [phone]

## 2014-03-02 NOTE — Telephone Encounter (Signed)
Patient aware that Ander SladeBill Oxford will not refill hydrocodone either

## 2014-04-15 ENCOUNTER — Other Ambulatory Visit: Payer: Self-pay | Admitting: Nurse Practitioner

## 2014-04-15 DIAGNOSIS — S0300XS Dislocation of jaw, unspecified side, sequela: Secondary | ICD-10-CM

## 2014-04-16 MED ORDER — DIAZEPAM 10 MG PO TABS
10.0000 mg | ORAL_TABLET | Freq: Every evening | ORAL | Status: DC | PRN
Start: 2014-04-16 — End: 2014-05-15

## 2014-04-16 NOTE — Telephone Encounter (Signed)
Valium rx ready for pick up

## 2014-04-16 NOTE — Telephone Encounter (Signed)
Script ready for patient . 

## 2014-05-02 ENCOUNTER — Ambulatory Visit: Payer: Medicaid Other | Admitting: Nurse Practitioner

## 2014-05-02 ENCOUNTER — Other Ambulatory Visit: Payer: Self-pay | Admitting: Oral Surgery

## 2014-05-02 DIAGNOSIS — G8928 Other chronic postprocedural pain: Secondary | ICD-10-CM

## 2014-05-15 ENCOUNTER — Other Ambulatory Visit: Payer: Self-pay | Admitting: Nurse Practitioner

## 2014-05-15 DIAGNOSIS — S0300XS Dislocation of jaw, unspecified side, sequela: Secondary | ICD-10-CM

## 2014-05-15 MED ORDER — DIAZEPAM 10 MG PO TABS: 10.0000 mg | ORAL_TABLET | Freq: Every evening | ORAL | Status: DC | PRN

## 2014-05-15 NOTE — Telephone Encounter (Signed)
Please call in valium with 0 refills 

## 2014-05-16 NOTE — Telephone Encounter (Signed)
rx called into pharmacy

## 2014-05-20 ENCOUNTER — Other Ambulatory Visit: Payer: Self-pay | Admitting: Oral Surgery

## 2014-05-20 ENCOUNTER — Ambulatory Visit
Admission: RE | Admit: 2014-05-20 | Discharge: 2014-05-20 | Disposition: A | Discharge status: Home / Self Care | Payer: Medicaid Other | Source: Ambulatory Visit | Attending: Oral Surgery | Admitting: Oral Surgery

## 2014-05-20 DIAGNOSIS — G8928 Other chronic postprocedural pain: Secondary | ICD-10-CM

## 2014-05-20 DIAGNOSIS — Z77018 Contact with and (suspected) exposure to other hazardous metals: Secondary | ICD-10-CM

## 2014-06-12 ENCOUNTER — Other Ambulatory Visit: Payer: Self-pay | Admitting: Oral Surgery

## 2014-06-12 DIAGNOSIS — H9212 Otorrhea, left ear: Secondary | ICD-10-CM

## 2014-06-21 ENCOUNTER — Inpatient Hospital Stay: Admission: RE | Admit: 2014-06-21 | Payer: Medicaid Other | Source: Ambulatory Visit

## 2014-06-27 ENCOUNTER — Other Ambulatory Visit: Payer: Self-pay | Admitting: Nurse Practitioner

## 2014-06-27 DIAGNOSIS — S0300XS Dislocation of jaw, unspecified side, sequela: Secondary | ICD-10-CM

## 2014-06-27 MED ORDER — DIAZEPAM 10 MG PO TABS: 10.0000 mg | ORAL_TABLET | Freq: Every evening | ORAL | Status: DC | PRN

## 2014-06-27 NOTE — Telephone Encounter (Signed)
Patient aware rx ready to be picked up 

## 2014-06-27 NOTE — Telephone Encounter (Signed)
Valium rx ready for pick up

## 2014-08-19 ENCOUNTER — Other Ambulatory Visit: Payer: Self-pay | Admitting: Nurse Practitioner

## 2014-08-19 DIAGNOSIS — S0300XS Dislocation of jaw, unspecified side, sequela: Secondary | ICD-10-CM

## 2014-08-19 MED ORDER — DIAZEPAM 10 MG PO TABS: 10.0000 mg | ORAL_TABLET | Freq: Every evening | ORAL | Status: DC | PRN

## 2014-09-06 ENCOUNTER — Ambulatory Visit (INDEPENDENT_AMBULATORY_CARE_PROVIDER_SITE_OTHER): Payer: Medicaid Other | Admitting: Family

## 2014-09-06 ENCOUNTER — Encounter: Payer: Self-pay | Admitting: Family

## 2014-09-06 VITALS — BP 119/69 | HR 60 | Temp 97.5°F | Ht 69.0 in | Wt 154.0 lb

## 2014-09-06 DIAGNOSIS — I83015 Varicose veins of right lower extremity with ulcer other part of foot: Secondary | ICD-10-CM | POA: Diagnosis not present

## 2014-09-06 DIAGNOSIS — I83019 Varicose veins of right lower extremity with ulcer of unspecified site: Secondary | ICD-10-CM | POA: Diagnosis not present

## 2014-09-06 DIAGNOSIS — L97919 Non-pressure chronic ulcer of unspecified part of right lower leg with unspecified severity: Secondary | ICD-10-CM

## 2014-09-06 DIAGNOSIS — I83011 Varicose veins of right lower extremity with ulcer of thigh: Secondary | ICD-10-CM

## 2014-09-06 DIAGNOSIS — R252 Cramp and spasm: Secondary | ICD-10-CM

## 2014-09-06 DIAGNOSIS — I83012 Varicose veins of right lower extremity with ulcer of calf: Secondary | ICD-10-CM | POA: Diagnosis not present

## 2014-09-06 DIAGNOSIS — I83013 Varicose veins of right lower extremity with ulcer of ankle: Secondary | ICD-10-CM | POA: Diagnosis not present

## 2014-09-06 DIAGNOSIS — I83018 Varicose veins of right lower extremity with ulcer other part of lower leg: Secondary | ICD-10-CM

## 2014-09-06 DIAGNOSIS — I83014 Varicose veins of right lower extremity with ulcer of heel and midfoot: Secondary | ICD-10-CM | POA: Diagnosis not present

## 2014-09-06 NOTE — Patient Instructions (Addendum)
Leg Cramps Leg cramps that occur during exercise can be caused by poor circulation or dehydration. However, muscle cramps that occur at rest or during the night are usually not due to any serious medical problem. Heat cramps may cause muscle spasms during hot weather.  CAUSES There is no clear cause for muscle cramps. However, dehydration may be a factor for those who do not drink enough fluids and those who exercise in the heat. Imbalances in the level of sodium, potassium, calcium or magnesium in the muscle tissue may also be a factor. Some medications, such as water pills (diuretics), may cause loss of chemicals that the body needs (like sodium and potassium) and cause muscle cramps. TREATMENT   Make sure your diet has enough fluids and essential minerals for the muscle to work normally.  Avoid strenuous exercise for several days if you have been having frequent leg cramps.  Stretch and massage the cramped muscle for several minutes.  Some medicines may be helpful in some patients with night cramps. Only take over-the-counter or prescription medicines as directed by your caregiver. SEEK IMMEDIATE MEDICAL CARE IF:   Your leg cramps become worse.  Your foot becomes cold, numb, or blue. Document Released: 05/06/2004 Document Revised: 06/21/2011 Document Reviewed: 04/23/2008 [facility] Patient Information 2015 [place]. This information is not intended to replace advice given to you by your health care provider. Make sure you discuss any questions you have with your health care provider. Varicose Veins Varicose veins are veins that have become enlarged and twisted. CAUSES This condition is the result of valves in the veins not working properly. Valves in the veins help return blood from the leg to the heart. If these valves are damaged, blood flows backwards and backs up into the veins in the leg near the skin. This causes the veins to become larger. People who are on their feet a lot, who  are pregnant, or who are overweight are more likely to develop varicose veins. SYMPTOMS   Bulging, twisted-appearing, bluish veins, most commonly found on the legs.  Leg pain or a feeling of heaviness. These symptoms may be worse at the end of the day.  Leg swelling.  Skin color changes. DIAGNOSIS  Varicose veins can usually be diagnosed with an exam of your legs by your caregiver. He or she may recommend an ultrasound of your leg veins. TREATMENT  Most varicose veins can be treated at home.However, other treatments are available for people who have persistent symptoms or who want to treat the cosmetic appearance of the varicose veins. These include:  Laser treatment of very small varicose veins.  Medicine that is shot (injected) into the vein. This medicine hardens the walls of the vein and closes off the vein. This treatment is called sclerotherapy. Afterwards, you may need to wear clothing or bandages that apply pressure.  Surgery. HOME CARE INSTRUCTIONS   Do not stand or sit in one position for long periods of time. Do not sit with your legs crossed. Rest with your legs raised during the day.  Wear elastic stockings or support hose. Do not wear other tight, encircling garments around the legs, pelvis, or waist.  Walk as much as possible to increase blood flow.  Raise the foot of your bed at night with 2-inch blocks.  If you get a cut in the skin over the vein and the vein bleeds, lie down with your leg raised and press on it with a clean cloth until the bleeding stops. Then place  a bandage (dressing) on the cut. See your caregiver if it continues to bleed or needs stitches. SEEK MEDICAL CARE IF:   The skin around your ankle starts to break down.  You have pain, redness, tenderness, or hard swelling developing in your leg over a vein.  You are uncomfortable due to leg pain. Document Released: 01/06/2005 Document Revised: 06/21/2011 Document Reviewed: 05/25/2010 [facility]  Patient Information 2015 [place], [place]. This information is not intended to replace advice given to you by your health care provider. Make sure you discuss any questions you have with your health care provider.

## 2014-09-06 NOTE — Progress Notes (Signed)
   Subjective:    Patient ID: Kristi Holmes, female    DOB: [birth year], 34 y.o.   MRN: [id number]  HPI Pt presents to the office today for right leg cramps every night for the last month. Pt states the pain occurs where she has varicose veins. Pt states the nights she has "drank beer" she did not have any pain. Pt states when the cramps occur they last until she gets up in the morning and walks around for about a hour.    Review of Systems  Constitutional: Negative.   HENT: Negative.   Eyes: Negative.   Respiratory: Negative.  Negative for shortness of breath.   Cardiovascular: Negative.  Negative for palpitations.  Gastrointestinal: Negative.   Endocrine: Negative.   Genitourinary: Negative.   Musculoskeletal: Negative.   Neurological: Negative.  Negative for headaches.  Hematological: Negative.   Psychiatric/Behavioral: Negative.   All other systems reviewed and are negative.      Objective:   Physical Exam  Constitutional: She is oriented to person, place, and time. She appears well-developed and well-nourished. No distress.  HENT:  Head: Normocephalic and atraumatic.  Eyes: Pupils are equal, round, and reactive to light.  Neck: Normal range of motion. Neck supple. No thyromegaly present.  Cardiovascular: Normal rate, regular rhythm, normal heart sounds and intact distal pulses.   No murmur heard. Pulmonary/Chest: Effort normal and breath sounds normal. No respiratory distress. She has no wheezes.  Abdominal: Soft. Bowel sounds are normal. She exhibits no distension. There is no tenderness.  Musculoskeletal: Normal range of motion. She exhibits no edema or tenderness.  Neurological: She is alert and oriented to person, place, and time. She has normal reflexes. No cranial nerve deficit.  Skin: Skin is warm and dry.  Psychiatric: She has a normal mood and affect. Her behavior is normal. Judgment and thought content normal.  Vitals reviewed.     BP 119/69 mmHg  Pulse  60  Temp(Src) 97.5 F (36.4 C) (Oral)  Ht $R'5\' 9"'Dz$  (1.753 m)  Wt 154 lb (69.854 kg)  BMI 22.73 kg/m2  LMP 08/30/2014     Assessment & Plan:  1. Cramps of right lower extremity - CMP14+EGFR - Magnesium  2. Varicose veins of lower extremities with ulcer, right  Labs pending Force fluids If labs are WNL will refer to [facility]   RTO prn  Evelina Dun, FNP

## 2014-09-08 LAB — MAGNESIUM: MAGNESIUM: 2 mg/dL (ref 1.6–2.3)

## 2014-09-08 LAB — CMP14+EGFR
A/G RATIO: 2 (ref 1.1–2.5)
ALK PHOS: 38 IU/L — AB (ref 39–117)
ALT: 7 IU/L (ref 0–32)
AST: 15 IU/L (ref 0–40)
Albumin: 4.1 g/dL (ref 3.5–5.5)
BUN/Creatinine Ratio: 14 (ref 8–20)
BUN: 10 mg/dL (ref 6–20)
Bilirubin Total: <0.2 mg/dL (ref 0.0–1.2)
CO2: 23 mmol/L (ref 18–29)
CREATININE: 0.73 mg/dL (ref 0.57–1.00)
Calcium: 8.8 mg/dL (ref 8.7–10.2)
Chloride: 98 mmol/L (ref 97–108)
GFR, EST AFRICAN AMERICAN: 125 mL/min/{1.73_m2} (ref >59)
GFR, EST NON AFRICAN AMERICAN: 109 mL/min/{1.73_m2} (ref >59)
Globulin, Total: 2.1 g/dL (ref 1.5–4.5)
Glucose: 75 mg/dL (ref 65–99)
Potassium: 4 mmol/L (ref 3.5–5.2)
SODIUM: 141 mmol/L (ref 134–144)
TOTAL PROTEIN: 6.2 g/dL (ref 6.0–8.5)

## 2014-09-09 ENCOUNTER — Emergency Department (HOSPITAL_COMMUNITY)
Admission: EM | Admit: 2014-09-09 | Discharge: 2014-09-09 | Disposition: A | Discharge status: Home / Self Care | Payer: Medicaid Other | Attending: Emergency Medicine | Admitting: Emergency Medicine

## 2014-09-09 ENCOUNTER — Encounter (HOSPITAL_COMMUNITY): Payer: Self-pay | Admitting: Emergency Medicine

## 2014-09-09 DIAGNOSIS — G8929 Other chronic pain: Secondary | ICD-10-CM | POA: Insufficient documentation

## 2014-09-09 DIAGNOSIS — R6884 Jaw pain: Secondary | ICD-10-CM | POA: Diagnosis present

## 2014-09-09 DIAGNOSIS — M79661 Pain in right lower leg: Secondary | ICD-10-CM | POA: Insufficient documentation

## 2014-09-09 DIAGNOSIS — Z79899 Other long term (current) drug therapy: Secondary | ICD-10-CM | POA: Diagnosis not present

## 2014-09-09 DIAGNOSIS — Z720 Tobacco use: Secondary | ICD-10-CM | POA: Insufficient documentation

## 2014-09-09 DIAGNOSIS — M79604 Pain in right leg: Secondary | ICD-10-CM

## 2014-09-09 HISTORY — DX: Other chronic pain: G89.29

## 2014-09-09 HISTORY — DX: Arthralgia of temporomandibular joint, unspecified side: M26.629

## 2014-09-09 HISTORY — DX: Pain in leg, unspecified: M79.606

## 2014-09-09 NOTE — ED Provider Notes (Signed)
CSN: [id number]     Arrival date & time 09/09/14  1255 History   This chart was scribed for non-physician practitioner, Ivery Quale, PA-C working with Samuel Jester, DO by Gwenyth Ober, ED scribe. This patient was seen in room APFT22/APFT22 and the patient's care was started at 2:34 PM   Chief Complaint  Patient presents with  . Jaw Pain   Patient is a 34 y.o. female presenting with leg pain. The history is provided by the patient. No language interpreter was used.  Leg Pain Location:  Leg Injury: no   Leg location:  R lower leg Pain details:    Quality:  Aching   Radiates to:  Does not radiate   Severity:  Moderate   Onset quality:  Gradual   Timing:  Intermittent   Progression:  Unchanged Chronicity:  New Dislocation: no   Foreign body present:  No foreign bodies Prior injury to area:  No Relieved by:  Movement Ineffective treatments:  Elevation and heat   HPI Comments: Kristi Holmes is a 34 y.o. female with a history of TMJ and chronic leg pain who presents to the Emergency Department complaining of intermittent, moderate right calf pain that occurs nightly and started a few months ago. Pt reports bulging veins in the affected area as an associated symptom. She has tried massaging the area, elevation, increased exercise before bed, Aspirin and heat with no relief. She notes pain improves with EtOH use and walking. Pt has been evaluated by her PCP for the same who did lab work. She denies numbness and leg swelling as associated symptoms.  Pt also complains of chronic jaw pain that became worse in the last few months. She states difficulty chewing secondary to the pain and bilateral ear pain as associated symptoms. Pt had surgery for TMJ in October, performed by Ocie Doyne, DMD. She denies fever.  Past Medical History  Diagnosis Date  . Chronic leg pain   . Chronic TMJ pain    History reviewed. No pertinent past surgical history. History reviewed. No pertinent  family history. History  Substance Use Topics  . Smoking status: Current Every Day Smoker -- 1.00 packs/day    Types: Cigarettes  . Smokeless tobacco: Not on file  . Alcohol Use: No   OB History    No data available     Review of Systems  Cardiovascular: Negative for leg swelling.  Musculoskeletal: Positive for arthralgias. Negative for joint swelling.  Neurological: Negative for numbness.  All other systems reviewed and are negative.   Allergies  Review of patient's allergies indicates no known allergies.  Home Medications   Prior to Admission medications   Medication Sig Start Date End Date Taking? Authorizing Provider  citalopram (CELEXA) 20 MG tablet TAKE 1 TABLET (20 MG TOTAL) BY MOUTH DAILY. 02/24/14   Deatra Canter, FNP  diazepam (VALIUM) 10 MG tablet Take 1 tablet (10 mg total) by mouth at bedtime as needed for anxiety. 08/19/14   Mary-Margaret Daphine Deutscher, FNP   BP 145/94 mmHg  Pulse 83  Temp(Src) 98.1 F (36.7 C) (Oral)  Resp 18  Ht  (1.753 m)  Wt 153 lb (69.4 kg)  BMI 22.58 kg/m2  SpO2 100%  LMP 08/30/2014 Physical Exam  Constitutional: She is oriented to person, place, and time. She appears well-developed and well-nourished. No distress.  HENT:  Head: Normocephalic and atraumatic.  Eyes: Conjunctivae and EOM are normal.  Neck: Neck supple.  Cardiovascular: Normal rate.   Pulmonary/Chest: Breath sounds  normal. No respiratory distress.  Musculoskeletal:  Right DP/PT pulses 2+; cap refill <2 s; no temperature changes of right LE; no mass of posterior knee; no effusion of the knee; negative Homan's sign; few varicose veins present; no cellulitis; no lesions between the toes; no asymmetry of the LE  Neurological: She is alert and oriented to person, place, and time.  Skin: Skin is warm.  Psychiatric: Her behavior is normal.  Nursing note and vitals reviewed.   ED Course  Procedures   DIAGNOSTIC STUDIES: Oxygen Saturation is 100% on RA, normal by my  interpretation.    COORDINATION OF CARE: 2:47 PM Discussed treatment plan with pt which includes follow-up with her PCP. She agreed to plan.   Labs Review Labs Reviewed - No data to display  Imaging Review No results found.   EKG Interpretation None      MDM  Vital signs are well within normal limits. Pulse oximetry is 100% on room air. Within normal limits by my interpretation.  The bilateral jaw pain is a chronic pain. The patient wants to see another your nose and throat specialist. I have suggested to her that she [facility] one [facility] the [facility].  The examination of the lower extremity shows no evidence of infection. There is no motor or sensory deficits appreciated. There are a few varicose veins, but no cellulitis or other related problems. I have suggested to the patient that she increase fluids, that she use an extra dose of her Valium in the evening, and then the 10 mg at bedtime. The patient is scheduled to see a vascular specialist soon.    Final diagnoses:  None    **I have reviewed nursing notes, vital signs, and all appropriate lab and imaging results for this patient.*  **I personally performed the services described in this documentation, which was scribed in my presence. The recorded information has been reviewed and is accurate.I have reviewed nursing notes, vital signs, and all appropriate lab and imaging results for this patient.Ivery Quale*   Emran Molzahn, PA-C 09/09/14 1510  Samuel JesterKathleen McManus, DO 09/12/14 2009

## 2014-09-09 NOTE — Discharge Instructions (Signed)
Please use 5 mg of Valium each evening, and continue your 10 mg at bedtime for your leg discomfort. Please see the vascular specialist as scheduled. You may benefit from a consultation with one of the [facility] your [facility] and throat clinics, for evaluation concerning your temporomandibular joint pain.

## 2014-09-09 NOTE — ED Notes (Signed)
Pt states that she has been having calf pain bilaterally and jaw pain for the past few months.  States that she has too much pain to sleep at night.

## 2014-09-27 ENCOUNTER — Telehealth: Payer: Self-pay | Admitting: Family

## 2014-09-27 ENCOUNTER — Other Ambulatory Visit: Payer: Self-pay

## 2014-09-27 DIAGNOSIS — S0300XS Dislocation of jaw, unspecified side, sequela: Secondary | ICD-10-CM

## 2014-09-27 MED ORDER — DIAZEPAM 10 MG PO TABS: 10.0000 mg | ORAL_TABLET | Freq: Every evening | ORAL | Status: DC | PRN

## 2014-09-27 NOTE — Telephone Encounter (Signed)
Refill call to [facility]

## 2014-09-27 NOTE — Telephone Encounter (Signed)
Last seen 09/06/14  Kristi Holmes  If approved route to nurse to call into [facility] 

## 2014-10-02 ENCOUNTER — Telehealth: Payer: Self-pay

## 2014-10-02 DIAGNOSIS — I8390 Asymptomatic varicose veins of unspecified lower extremity: Secondary | ICD-10-CM

## 2014-10-02 NOTE — Telephone Encounter (Signed)
Patient said that she is suppose to get a referral for Vein Dr.

## 2014-10-04 ENCOUNTER — Other Ambulatory Visit: Payer: Medicaid Other

## 2014-10-09 ENCOUNTER — Other Ambulatory Visit: Payer: Self-pay | Admitting: *Deleted

## 2014-10-09 ENCOUNTER — Other Ambulatory Visit (HOSPITAL_COMMUNITY): Payer: Self-pay | Admitting: Family Medicine

## 2014-10-09 ENCOUNTER — Ambulatory Visit (HOSPITAL_COMMUNITY)
Admission: RE | Admit: 2014-10-09 | Discharge: 2014-10-09 | Disposition: A | Discharge status: Home / Self Care | Payer: BLUE CROSS/BLUE SHIELD | Source: Ambulatory Visit | Attending: Family Medicine | Admitting: Family Medicine

## 2014-10-09 DIAGNOSIS — I8391 Asymptomatic varicose veins of right lower extremity: Secondary | ICD-10-CM | POA: Insufficient documentation

## 2014-10-09 DIAGNOSIS — I83891 Varicose veins of right lower extremities with other complications: Secondary | ICD-10-CM

## 2014-10-17 ENCOUNTER — Encounter: Payer: Self-pay | Admitting: Vascular Surgery

## 2014-10-21 ENCOUNTER — Encounter: Payer: Self-pay | Admitting: Vascular Surgery

## 2014-10-21 ENCOUNTER — Inpatient Hospital Stay (HOSPITAL_COMMUNITY): Admission: RE | Admit: 2014-10-21 | Payer: Medicaid Other | Source: Ambulatory Visit

## 2014-10-21 ENCOUNTER — Ambulatory Visit (INDEPENDENT_AMBULATORY_CARE_PROVIDER_SITE_OTHER): Payer: BLUE CROSS/BLUE SHIELD | Admitting: Vascular Surgery

## 2014-10-21 VITALS — BP 128/84 | HR 79 | Resp 14 | Ht 69.0 in | Wt 150.0 lb

## 2014-10-21 DIAGNOSIS — I83899 Varicose veins of unspecified lower extremities with other complications: Secondary | ICD-10-CM | POA: Insufficient documentation

## 2014-10-21 DIAGNOSIS — I83891 Varicose veins of right lower extremities with other complications: Secondary | ICD-10-CM | POA: Diagnosis not present

## 2014-10-21 NOTE — Progress Notes (Signed)
Subjective:     Patient ID: Kristi Holmes, female   DOB: 26-Mar-1981, 34 y.o.   MRN: [id number]  HPI this 34 year old female is evaluated for varicose vein right leg. She has aching and throbbing discomfort which happened 3 or 4 AM each night in the right posterior calf. She has noted a prominent varicose vein for the past few years. She has no history of DVT thrombophlebitis stasis ulcers or bleeding. She does not relate to compression stockings. She has an early varicose vein in the left posterior thigh. [facility]. This revealed no DVT or reflux.  Past Medical History  Diagnosis Date  . Chronic leg pain   . Chronic TMJ pain   . Varicose veins     History  Substance Use Topics  . Smoking status: Current Every Day Smoker -- 1.00 packs/day    Types: Cigarettes  . Smokeless tobacco: Never Used  . Alcohol Use: No    No family history on file.  No Known Allergies   Current outpatient prescriptions:  .  diazepam (VALIUM) 10 MG tablet, Take 1 tablet (10 mg total) by mouth at bedtime as needed for anxiety., Disp: 30 tablet, Rfl: 0 .  citalopram (CELEXA) 20 MG tablet, TAKE 1 TABLET (20 MG TOTAL) BY MOUTH DAILY., Disp: 30 tablet, Rfl: 2  Filed Vitals:   10/21/14 1455  BP: 128/84  Pulse: 79  Resp: 14  Height: 5\' 9"  (1.753 m)  Weight: 150 lb (68.04 kg)    Body mass index is 22.14 kg/(m^2).           Review of Systems denies chest pain, dyspnea on exertion, PND, orthopnea, hemoptysis. Totally negative review of systems     Objective:   Physical Exam BP 128/84 mmHg  Pulse 79  Resp 14  Ht 5\' 9"  (1.753 m)  Wt 150 lb (68.04 kg)  BMI 22.14 kg/m2  Gen.-alert and oriented x3 in no apparent distress HEENT normal for age Lungs no rhonchi or wheezing Cardiovascular regular rhythm no murmurs carotid pulses 3+ palpable no bruits audible Abdomen soft nontender no palpable masses Musculoskeletal free of  major deformities Skin clear  -no rashes Neurologic normal Lower extremities 3+ femoral and dorsalis pedis pulses palpable bilaterally with no edema Right leg with small cluster of varicosities and posterior calf. No hyperpigmentation or ulceration noted. No spider veins or reticular veins noted. Left leg with prominent vein and distal thigh posteriorly but no varicosities.  Today I reviewed the venous duplex [facility] which revealed no DVT, deep or superficial venous reflux, or superficial thrombophlebitis.      Assessment:     Painful varicosities right posterior calf with no evidence of superficial vein reflux supplying this    Plan:     Discussed with patient the possibility of foam sclerotherapy to treat this. She will consider this. I did tell her that this may not relieve her symptoms since they are somewhat atypical occurring at night while she is in bed and not in the daytime while she is standing.

## 2014-10-28 ENCOUNTER — Encounter: Payer: Self-pay | Admitting: *Deleted

## 2014-10-30 ENCOUNTER — Ambulatory Visit: Payer: BLUE CROSS/BLUE SHIELD | Admitting: *Deleted

## 2014-12-05 ENCOUNTER — Telehealth: Payer: Self-pay | Admitting: Nurse Practitioner

## 2014-12-05 DIAGNOSIS — S0300XS Dislocation of jaw, unspecified side, sequela: Secondary | ICD-10-CM

## 2014-12-06 MED ORDER — DIAZEPAM 10 MG PO TABS: 10.0000 mg | ORAL_TABLET | Freq: Every evening | ORAL | Status: DC | PRN

## 2014-12-06 NOTE — Telephone Encounter (Signed)
Valium 10 mg, one po qhs, 30 with no refills [facility], patient informed

## 2014-12-06 NOTE — Telephone Encounter (Signed)
Please call in valium  1 po QHS #30  with 0 refills

## 2015-11-04 ENCOUNTER — Encounter: Payer: Self-pay | Admitting: Nurse Practitioner

## 2015-11-04 ENCOUNTER — Ambulatory Visit (INDEPENDENT_AMBULATORY_CARE_PROVIDER_SITE_OTHER): Payer: BLUE CROSS/BLUE SHIELD | Admitting: Nurse Practitioner

## 2015-11-04 VITALS — BP 130/91 | HR 83 | Temp 98.1°F | Ht 69.0 in | Wt 150.0 lb

## 2015-11-04 DIAGNOSIS — J029 Acute pharyngitis, unspecified: Secondary | ICD-10-CM | POA: Diagnosis not present

## 2015-11-04 DIAGNOSIS — M26603 Bilateral temporomandibular joint disorder, unspecified: Secondary | ICD-10-CM

## 2015-11-04 DIAGNOSIS — S0300XS Dislocation of jaw, unspecified side, sequela: Secondary | ICD-10-CM

## 2015-11-04 LAB — CULTURE, GROUP A STREP

## 2015-11-04 LAB — RAPID STREP SCREEN (MED CTR MEBANE ONLY): Strep Gp A Ag, IA W/Reflex: NEGATIVE

## 2015-11-04 MED ORDER — DIAZEPAM 10 MG PO TABS: 10.0000 mg | ORAL_TABLET | Freq: Every evening | ORAL | 0 refills | Status: DC | PRN

## 2015-11-04 NOTE — Progress Notes (Signed)
   Subjective:    Patient ID: Kristi Holmes, female    DOB: [birth year], 35 y.o.   MRN: [id number]  HPI Patient in today c/o:  sore throat -that started 1 week ago and has gotten worse- denies fever, cough or congestion. Continuing jaw pain- that she use to take pain meds for- was suppose to start going to pain clinic but husband lost insurance and she was  Not able to go- valium really helps with jaw pain. She has had no pain meds or valium for awhile. Insomnia- has been going  On for awhile- takes a lot  Of things to go to sleep- will take several things in combination and still wakes up every 20 minutes.  Review of Systems  Constitutional: Negative for chills and fever.  HENT: Positive for sore throat and trouble swallowing. Negative for congestion and voice change.   Respiratory: Negative.   Cardiovascular: Negative.   Genitourinary: Negative.   Neurological: Negative.   Psychiatric/Behavioral: Negative.        Objective:   Physical Exam  Constitutional: She appears well-developed and well-nourished. No distress.  HENT:  Right Ear: Tympanic membrane, external ear and ear canal normal.  Left Ear: Hearing, tympanic membrane, external ear and ear canal normal.  Nose: Mucosal edema and rhinorrhea present. Right sinus exhibits no maxillary sinus tenderness and no frontal sinus tenderness. Left sinus exhibits no maxillary sinus tenderness and no frontal sinus tenderness.  Mouth/Throat: Uvula is midline and mucous membranes are normal. Posterior oropharyngeal erythema (mild) present.  Jaws pop bil when opening and closing  Neck: Normal range of motion. Neck supple.  Cardiovascular: Normal rate and regular rhythm.   Pulmonary/Chest: Effort normal and breath sounds normal.  Abdominal: Soft. Bowel sounds are normal.  Lymphadenopathy:    She has no cervical adenopathy.  Neurological: She is alert. No cranial nerve deficit.  Skin: Skin is warm.  Psychiatric: She has a normal mood and  affect. Her behavior is normal. Judgment and thought content normal.   BP (!) 130/91   Pulse 83   Temp 98.1 F (36.7 C) (Oral)   Ht 5\' 9"  (1.753 m)   Wt 150 lb (68 kg)   BMI 22.15 kg/m         Assessment & Plan:  1. Sore throat - Rapid strep screen (not at [facility])  2. Viral pharyngitis Force fluids Motrin or tylenol OTC OTC decongestant Throat lozenges if help New toothbrush in 3 days   3. TMJ (dislocation of temporomandibular joint), sequela Need to get mouth guard to wear at night from dentist Patient requested pain meds and told her she could  Not get any without drug screen - diazepam (VALIUM) 10 MG tablet; Take 1 tablet (10 mg total) by mouth at bedtime as needed for anxiety.  Dispense: 30 tablet; Refill: 0 - [employer] Select 13 (MW), Urine  Mary-Margaret Daphine Deutscher, FNP

## 2015-11-04 NOTE — Patient Instructions (Signed)

## 2015-11-05 ENCOUNTER — Telehealth: Payer: Self-pay | Admitting: Family

## 2015-11-05 NOTE — Telephone Encounter (Signed)
This needs to wait until MMM returns

## 2015-11-05 NOTE — Telephone Encounter (Signed)
Please advise. Seen MMM yesterday.

## 2015-11-06 ENCOUNTER — Telehealth: Payer: Self-pay | Admitting: Family

## 2015-11-06 MED ORDER — AZITHROMYCIN 250 MG PO TABS: ORAL_TABLET | ORAL | 0 refills | Status: DC

## 2015-11-06 NOTE — Telephone Encounter (Signed)
Pt aware Rx sent to pharmacy 

## 2015-11-06 NOTE — Telephone Encounter (Signed)
rx sent to pharmacy

## 2015-11-06 NOTE — Telephone Encounter (Signed)
Azithromycin sent to the pharmacy.  Murtis Sink, MD [facility] 11/06/2015, 6:09 PM

## 2015-11-07 NOTE — Telephone Encounter (Signed)
Patient aware.

## 2015-11-13 ENCOUNTER — Encounter: Payer: Self-pay | Admitting: Nurse Practitioner

## 2015-11-13 DIAGNOSIS — F1110 Opioid abuse, uncomplicated: Secondary | ICD-10-CM | POA: Insufficient documentation

## 2015-11-13 LAB — TOXASSURE SELECT 13 (MW), URINE: PDF: 0

## 2018-03-28 ENCOUNTER — Ambulatory Visit: Payer: BLUE CROSS/BLUE SHIELD | Admitting: Family Medicine

## 2018-03-28 ENCOUNTER — Encounter: Payer: Self-pay | Admitting: Family Medicine

## 2018-03-28 VITALS — BP 113/72 | HR 78 | Temp 98.4°F | Ht 69.0 in | Wt 154.0 lb

## 2018-03-28 DIAGNOSIS — J101 Influenza due to other identified influenza virus with other respiratory manifestations: Secondary | ICD-10-CM

## 2018-03-28 LAB — VERITOR FLU A/B WAIVED
INFLUENZA B: NEGATIVE
Influenza A: POSITIVE — AB

## 2018-03-28 MED ORDER — GUAIFENESIN-CODEINE 100-10 MG/5ML PO SOLN: 5.0000 mL | Freq: Four times a day (QID) | ORAL | 0 refills | Status: DC | PRN

## 2018-03-28 NOTE — Patient Instructions (Addendum)
You are positive for influenza A.  This is highly contagious.  I recommend that you remain out of work for the remainder of the week.  Make sure that you are hydrating and resting.  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  The cough medication that has been prescribed Robitussin contains codeine.  We discussed that this is a controlled substance and can cause sedation.  She not use more than as directed.  Given your history of substance use disorder, there is a possibility that this can cause relapse.  Please use this with extreme caution.  We discussed alternatives and you declined these today.   Influenza, Adult Influenza ("the flu") is an infection in the lungs, nose, and throat (respiratory tract). It is caused by a virus. The flu causes many common cold symptoms, as well as a high fever and body aches. It can make you feel very sick. The flu spreads easily from person to person (is contagious). Getting a flu shot (influenza vaccination) every year is the best way to prevent the flu. Follow these instructions at home:  Take over-the-counter and prescription medicines only as told by your doctor.  Use a cool mist humidifier to add moisture (humidity) to the air in your home. This can make it easier to breathe.  Rest as needed.  Drink enough fluid to keep your pee (urine) clear or pale yellow.  Cover your mouth and nose when you cough or sneeze.  Wash your hands with soap and water often, especially after you cough or sneeze. If you cannot use soap and water, use hand sanitizer.  Stay home from work or school as told by your doctor. Unless you are visiting your doctor, try to avoid leaving home until your fever has been gone for 24 hours without the use of medicine.  Keep all follow-up visits as told by your doctor. This is important. How is this prevented?  Getting a yearly (annual) flu shot is the best way to avoid getting the flu. You may get the flu shot in late  summer, fall, or winter. Ask your doctor when you should get your flu shot.  Wash your hands often or use hand sanitizer often.  Avoid contact with people who are sick during cold and flu season.  Eat healthy foods.  Drink plenty of fluids.  Get enough sleep.  Exercise regularly. Contact a doctor if:  You get new symptoms.  You have: ? Chest pain. ? Watery poop (diarrhea). ? A fever.  Your cough gets worse.  You start to have more mucus.  You feel sick to your stomach (nauseous).  You throw up (vomit). Get help right away if:  You start to be short of breath or have trouble breathing.  Your skin or nails turn a bluish color.  You have very bad pain or stiffness in your neck.  You get a sudden headache.  You get sudden pain in your face or ear.  You cannot stop throwing up. This information is not intended to replace advice given to you by your health care provider. Make sure you discuss any questions you have with your health care provider. Document Released: 01/06/2008 Document Revised: 09/04/2015 Document Reviewed: 01/21/2015 Elsevier Interactive Patient Education  2017 [employer]

## 2018-03-28 NOTE — Progress Notes (Signed)
Subjective: CC: Cough PCP: Kristi Spencer, FNP [id number] Kristi Holmes is a 37 y.o. female presenting to clinic today for:  1. Cough Patient reports severe cough that started Saturday.  She notes associated rib pain, she vomited 4 times yesterday and started having diarrhea this morning.  She reports rhinorrhea, myalgia, malaise and difficulty taking a deep breath in because her ribs hurt.  No hemoptysis.  She has been using NyQuil and DayQuil and TheraFlu with little improvement of symptoms.  She is tried Motrin and Tylenol for rib pain.  Both her dad and his wife are sick with similar. BTL for contraception.  She is an every day smoker.   ROS: Per HPI  No Known Allergies Past Medical History:  Diagnosis Date  . Chronic leg pain   . Chronic TMJ pain   . Varicose veins    No current outpatient medications on file. Social History   Socioeconomic History  . Marital status: Married    Spouse name: Not on file  . Number of children: Not on file  . Years of education: Not on file  . Highest education level: Not on file  Occupational History  . Not on file  Social Needs  . Financial resource strain: Not on file  . Food insecurity:    Worry: Not on file    Inability: Not on file  . Transportation needs:    Medical: Not on file    Non-medical: Not on file  Tobacco Use  . Smoking status: Current Every Day Smoker    Packs/day: 1.00    Types: Cigarettes  . Smokeless tobacco: Never Used  Substance and Sexual Activity  . Alcohol use: No    Alcohol/week: 0.0 standard drinks  . Drug use: No  . Sexual activity: Not on file  Lifestyle  . Physical activity:    Days per week: Not on file    Minutes per session: Not on file  . Stress: Not on file  Relationships  . Social connections:    Talks on phone: Not on file    Gets together: Not on file    Attends religious service: Not on file    Active member of club or organization: Not on file    Attends meetings of clubs or  organizations: Not on file    Relationship status: Not on file  . Intimate partner violence:    Fear of current or ex partner: Not on file    Emotionally abused: Not on file    Physically abused: Not on file    Forced sexual activity: Not on file  Other Topics Concern  . Not on file  Social History Narrative  . Not on file   No family history on file.  Objective: Office vital signs reviewed. BP 113/72   Pulse 78   Temp 98.4 F (36.9 C) (Oral)   Ht 5\' 9"  (1.753 Kristi)   Wt 154 lb (69.9 kg)   SpO2 97%   BMI 22.74 kg/Kristi   Physical Examination:  General: Awake, alert, well nourished, nontoxic but ill appearing. No acute distress HEENT: Normal    Neck: No masses palpated. No lymphadenopathy    Ears: Tympanic membranes intact, normal light reflex, no erythema, no bulging    Eyes: PERRLA, extraocular membranes intact, sclera white    Nose: nasal turbinates moist, clear nasal discharge    Throat: moist mucus membranes, no erythema, no tonsillar exudate.  Airway is patent Cardio: regular rate and rhythm, S1S2 heard, no  murmurs appreciated Pulm: clear to auscultation bilaterally, no wheezes, rhonchi or rales; normal work of breathing on room air  Assessment/ Plan: 37 y.o. female   1. Influenza A Positive for flu A.  Patient is afebrile nontoxic-appearing.  I have prescribed her Robitussin-AC for severe cough.  I did review the national cardiac database and saw that she had been previously treated with Suboxone.  During our visit, she noted that she was in remission from opioid use disorder.  We discussed that use of Robitussin-AC could precipitate relapse and I offered her instead benzonatate but she declined this.  She feels confident that she will not relapse with use of codeine.  We discussed that should not be used with driving or any operation of heavy machinery.  Of asked that she stay out of work for the remainder of the week.  Push oral fluids.  Reasons for emergent evaluation  emergency department discussed.  She will follow-up PRN. - Veritor Flu A/B Waived   Orders Placed This Encounter  Procedures  . Veritor Flu A/B Waived    Order Specific Question:   Source    Answer:   nasal   Meds ordered this encounter  Medications  . guaiFENesin-codeine 100-10 MG/5ML syrup    Sig: Take 5 mLs by mouth every 6 (six) hours as needed for cough.    Dispense:  60 mL    Refill:  0     Kristi Holmes Kristi SkainsM Odin Mariani, DO [facility] [phone]

## 2018-05-16 ENCOUNTER — Telehealth: Payer: Self-pay | Admitting: Family

## 2019-05-22 ENCOUNTER — Other Ambulatory Visit: Payer: Self-pay

## 2019-05-22 ENCOUNTER — Ambulatory Visit (INDEPENDENT_AMBULATORY_CARE_PROVIDER_SITE_OTHER): Payer: BC Managed Care – PPO | Admitting: Family

## 2019-05-22 ENCOUNTER — Encounter: Payer: Self-pay | Admitting: Family

## 2019-05-22 VITALS — BP 128/83 | HR 76 | Temp 97.8°F | Ht 69.0 in | Wt 223.2 lb

## 2019-05-22 DIAGNOSIS — L659 Nonscarring hair loss, unspecified: Secondary | ICD-10-CM | POA: Diagnosis not present

## 2019-05-22 DIAGNOSIS — K5900 Constipation, unspecified: Secondary | ICD-10-CM | POA: Diagnosis not present

## 2019-05-22 DIAGNOSIS — R635 Abnormal weight gain: Secondary | ICD-10-CM | POA: Diagnosis not present

## 2019-05-22 DIAGNOSIS — E669 Obesity, unspecified: Secondary | ICD-10-CM | POA: Diagnosis not present

## 2019-05-22 DIAGNOSIS — F17200 Nicotine dependence, unspecified, uncomplicated: Secondary | ICD-10-CM

## 2019-05-22 NOTE — Patient Instructions (Signed)
Exercising to Lose Weight Exercise is structured, repetitive physical activity to improve fitness and health. Getting regular exercise is important for everyone. It is especially important if you are overweight. Being overweight increases your risk of heart disease, stroke, diabetes, high blood pressure, and several types of cancer. Reducing your calorie intake and exercising can help you lose weight. Exercise is usually categorized as moderate or vigorous intensity. To lose weight, most people need to do a certain amount of moderate-intensity or vigorous-intensity exercise each week. Moderate-intensity exercise  Moderate-intensity exercise is any activity that gets you moving enough to burn at least three times more energy (calories) than if you were sitting. Examples of moderate exercise include:  Walking a mile in 15 minutes.  Doing light yard work.  Biking at an easy pace. Most people should get at least 150 minutes (2 hours and 30 minutes) a week of moderate-intensity exercise to maintain their body weight. Vigorous-intensity exercise Vigorous-intensity exercise is any activity that gets you moving enough to burn at least six times more calories than if you were sitting. When you exercise at this intensity, you should be working hard enough that you are not able to carry on a conversation. Examples of vigorous exercise include:  Running.  Playing a team sport, such as football, basketball, and soccer.  Jumping rope. Most people should get at least 75 minutes (1 hour and 15 minutes) a week of vigorous-intensity exercise to maintain their body weight. How can exercise affect me? When you exercise enough to burn more calories than you eat, you lose weight. Exercise also reduces body fat and builds muscle. The more muscle you have, the more calories you burn. Exercise also:  Improves mood.  Reduces stress and tension.  Improves your overall fitness, flexibility, and  endurance.  Increases bone strength. The amount of exercise you need to lose weight depends on:  Your age.  The type of exercise.  Any health conditions you have.  Your overall physical ability. Talk to your health care provider about how much exercise you need and what types of activities are safe for you. What actions can I take to lose weight? Nutrition   Make changes to your diet as told by your health care provider or diet and nutrition specialist (dietitian). This may include: ? Eating fewer calories. ? Eating more protein. ? Eating less unhealthy fats. ? Eating a diet that includes fresh fruits and vegetables, whole grains, low-fat dairy products, and lean protein. ? Avoiding foods with added fat, salt, and sugar.  Drink plenty of water while you exercise to prevent dehydration or heat stroke. Activity  Choose an activity that you enjoy and set realistic goals. Your health care provider can help you make an exercise plan that works for you.  Exercise at a moderate or vigorous intensity most days of the week. ? The intensity of exercise may vary from person to person. You can tell how intense a workout is for you by paying attention to your breathing and heartbeat. Most people will notice their breathing and heartbeat get faster with more intense exercise.  Do resistance training twice each week, such as: ? Push-ups. ? Sit-ups. ? Lifting weights. ? Using resistance bands.  Getting short amounts of exercise can be just as helpful as long structured periods of exercise. If you have trouble finding time to exercise, try to include exercise in your daily routine. ? Get up, stretch, and walk around every 30 minutes throughout the day. ? Go for a   walk during your lunch break. ? Park your car farther away from your destination. ? If you take public transportation, get off one stop early and walk the rest of the way. ? Make phone calls while standing up and walking  around. ? Take the stairs instead of elevators or escalators.  Wear comfortable clothes and shoes with good support.  Do not exercise so much that you hurt yourself, feel dizzy, or get very short of breath. Where to find more information  U.S. Department of Health and Human Services: ThisPath.fi  Centers for Disease Control and Prevention (CDC): FootballExhibition.com.br Contact a health care provider:  Before starting a new exercise program.  If you have questions or concerns about your weight.  If you have a medical problem that keeps you from exercising. Get help right away if you have any of the following while exercising:  Injury.  Dizziness.  Difficulty breathing or shortness of breath that does not go away when you stop exercising.  Chest pain.  Rapid heartbeat. Summary  Being overweight increases your risk of heart disease, stroke, diabetes, high blood pressure, and several types of cancer.  Losing weight happens when you burn more calories than you eat.  Reducing the amount of calories you eat in addition to getting regular moderate or vigorous exercise each week helps you lose weight. This information is not intended to replace advice given to you by your health care provider. Make sure you discuss any questions you have with your health care provider. Document Revised: 04/11/2017 Document Reviewed: 04/11/2017 Elsevier Patient Education  2020 Elsevier Inc. Preventing Unhealthy [employer], Adult Staying at a healthy weight is important to your overall health. When fat builds up in your body, you may become overweight or obese. Being overweight or obese increases your risk of developing certain health problems, such as heart disease, diabetes, sleeping problems, joint problems, and some types of cancer. Unhealthy weight gain is often the result of making unhealthy food choices or not getting enough exercise. You can make changes to your lifestyle to prevent obesity and stay as  healthy as possible. What nutrition changes can be made?   Eat only as much as your body needs. To do this: ? Pay attention to signs that you are hungry or full. Stop eating as soon as you feel full. ? If you feel hungry, try drinking water first before eating. Drink enough water so your urine is clear or pale yellow. ? Eat smaller portions. Pay attention to portion sizes when eating out. ? Look at serving sizes on food labels. Most foods contain more than one serving per container. ? Eat the recommended number of calories for your gender and activity level. For most active people, a daily total of 2,000 calories is appropriate. If you are trying to lose weight or are not very active, you may need to eat fewer calories. Talk with your health care provider or a diet and nutrition specialist (dietitian) about how many calories you need each day.  Choose healthy foods, such as: ? Fruits and vegetables. At each meal, try to fill at least half of your plate with fruits and vegetables. ? Whole grains, such as whole-wheat bread, brown rice, and quinoa. ? Lean meats, such as chicken or fish. ? Other healthy proteins, such as beans, eggs, or tofu. ? Healthy fats, such as nuts, seeds, fatty fish, and olive oil. ? Low-fat or fat-free dairy products.  Check food labels, and avoid food and drinks that: ? Are  high in calories. ? Have added sugar. ? Are high in sodium. ? Have saturated fats or trans fats.  Cook foods in healthier ways, such as by baking, broiling, or grilling.  Make a meal plan for the week, and shop with a grocery list to help you stay on track with your purchases. Try to avoid going to the grocery store when you are hungry.  When grocery shopping, try to shop around the outside of the store first, where the fresh foods are. Doing this helps you to avoid prepackaged foods, which can be high in sugar, salt (sodium), and fat. What lifestyle changes can be made?   Exercise for 30 or  more minutes on 5 or more days each week. Exercising may include brisk walking, yard work, biking, running, swimming, and team sports like basketball and soccer. Ask your health care provider which exercises are safe for you.  Do muscle-strengthening activities, such as lifting weights or using resistance bands, on 2 or more days a week.  Do not use any products that contain nicotine or tobacco, such as cigarettes and e-cigarettes. If you need help quitting, ask your health care provider.  Limit alcohol intake to no more than 1 drink a day for nonpregnant women and 2 drinks a day for men. One drink equals 12 oz of beer, 5 oz of wine, or 1 oz of hard liquor.  Try to get 7-9 hours of sleep each night. What other changes can be made?  Keep a food and activity journal to keep track of: ? What you ate and how many calories you had. Remember to count the calories in sauces, dressings, and side dishes. ? Whether you were active, and what exercises you did. ? Your calorie, weight, and activity goals.  Check your weight regularly. Track any changes. If you notice you have gained weight, make changes to your diet or activity routine.  Avoid taking weight-loss medicines or supplements. Talk to your health care provider before starting any new medicine or supplement.  Talk to your health care provider before trying any new diet or exercise plan. Why are these changes important? Eating healthy, staying active, and having healthy habits can help you to prevent obesity. Those changes also:  Help you manage stress and emotions.  Help you connect with friends and family.  Improve your self-esteem.  Improve your sleep.  Prevent long-term health problems. What can happen if changes are not made? Being obese or overweight can cause you to develop joint or bone problems, which can make it hard for you to stay active or do activities you enjoy. Being obese or overweight also puts stress on your heart  and lungs and can lead to health problems like diabetes, heart disease, and some cancers. Where to find more information Talk with your health care provider or a dietitian about healthy eating and healthy lifestyle choices. You may also find information from:  U.S. Department of Agriculture, MyPlate: https://ball-collins.biz/  American Heart Association: www.heart.org  Centers for Disease Control and Prevention: FootballExhibition.com.br Summary  Staying at a healthy weight is important to your overall health. It helps you to prevent certain diseases and health problems, such as heart disease, diabetes, joint problems, sleep disorders, and some types of cancer.  Being obese or overweight can cause you to develop joint or bone problems, which can make it hard for you to stay active or do activities you enjoy.  You can prevent unhealthy weight gain by eating a healthy diet, exercising regularly,  not smoking, limiting alcohol, and getting enough sleep.  Talk with your health care provider or a dietitian for guidance about healthy eating and healthy lifestyle choices. This information is not intended to replace advice given to you by your health care provider. Make sure you discuss any questions you have with your health care provider. Document Revised: 04/01/2017 Document Reviewed: 05/05/2016 Elsevier Patient Education  2020 [employer].

## 2019-05-22 NOTE — Progress Notes (Signed)
Subjective:    Patient ID: Kristi Holmes, female    DOB: Oct 12, 1980, 39 y.o.   MRN: [id number]  Chief Complaint  Patient presents with  . Weight Gain    gain 50 pounds in for months     HPI Pt presents to the office today with weight gain that she noticed in a rapid weight gain that started from June to September. She denies any medications changes, lifestyle changes.   Denies any hx of hypothyroidism or fatigue. She reports hair loss and  constipation.   She reports in June  2020 she weighed 150 lbs, which is 73 lb weight gain.   Review of Systems  Musculoskeletal:       Weight gain  All other systems reviewed and are negative.      Objective:   Physical Exam Vitals reviewed.  Constitutional:      General: She is not in acute distress.    Appearance: She is well-developed. She is obese.  HENT:     Head: Normocephalic and atraumatic.     Right Ear: Tympanic membrane normal.     Left Ear: Tympanic membrane normal.  Eyes:     Pupils: Pupils are equal, round, and reactive to light.  Neck:     Thyroid: No thyromegaly.  Cardiovascular:     Rate and Rhythm: Normal rate and regular rhythm.     Heart sounds: Normal heart sounds. No murmur.  Pulmonary:     Effort: Pulmonary effort is normal. No respiratory distress.     Breath sounds: Normal breath sounds. No wheezing.  Abdominal:     General: Bowel sounds are normal. There is no distension.     Palpations: Abdomen is soft.     Tenderness: There is no abdominal tenderness.  Musculoskeletal:        General: No tenderness. Normal range of motion.     Cervical back: Normal range of motion and neck supple.  Skin:    General: Skin is warm and dry.  Neurological:     Mental Status: She is alert and oriented to person, place, and time.     Cranial Nerves: No cranial nerve deficit.     Deep Tendon Reflexes: Reflexes are normal and symmetric.  Psychiatric:        Behavior: Behavior normal.        Thought Content:  Thought content normal.        Judgment: Judgment normal.       BP 128/83   Pulse 76   Temp 97.8 F (36.6 C) (Temporal)   Ht _0  (1.753 m)   Wt 223 lb 3.2 oz (101.2 kg)   SpO2 97%   BMI 32.96 kg/m      Assessment & Plan:  Kristi Holmes comes in today with chief complaint of Weight Gain (gain 50 pounds in for months )   Diagnosis and orders addressed:  1. Weight gain - CMP14+EGFR - CBC with Differential/Platelet - TSH - Vitamin B12  2. Obesity (BMI 30.0-34.9) - CMP14+EGFR - CBC with Differential/Platelet - TSH - Vitamin B12  3. Hair loss - CMP14+EGFR - CBC with Differential/Platelet - TSH - Vitamin B12  4. Constipation, unspecified constipation type - CMP14+EGFR - CBC with Differential/Platelet - TSH - Vitamin B12  5. Current smoker Smoking cessation discussed    Labs pending if all normal will start phentermine  Health Maintenance reviewed Diet and exercise encouraged  Follow up plan: As needed, 3 months if we start phentermine  Evelina Dun, FNP

## 2019-05-23 ENCOUNTER — Telehealth: Payer: Self-pay | Admitting: Family

## 2019-05-24 ENCOUNTER — Other Ambulatory Visit: Payer: Self-pay | Admitting: Family

## 2019-05-24 LAB — CMP14+EGFR
ALT: 15 IU/L (ref 0–32)
AST: 16 IU/L (ref 0–40)
Albumin/Globulin Ratio: 1.5 (ref 1.2–2.2)
Albumin: 4.2 g/dL (ref 3.8–4.8)
Alkaline Phosphatase: 74 IU/L (ref 39–117)
BUN/Creatinine Ratio: 8 — ABNORMAL LOW (ref 9–23)
BUN: 8 mg/dL (ref 6–20)
Bilirubin Total: <0.2 mg/dL (ref 0.0–1.2)
CO2: 22 mmol/L (ref 20–29)
Calcium: 9.1 mg/dL (ref 8.7–10.2)
Chloride: 101 mmol/L (ref 96–106)
Creatinine, Ser: 1 mg/dL (ref 0.57–1.00)
GFR calc Af Amer: 83 mL/min/{1.73_m2} (ref >59)
GFR calc non Af Amer: 72 mL/min/{1.73_m2} (ref >59)
Globulin, Total: 2.8 g/dL (ref 1.5–4.5)
Glucose: 109 mg/dL — ABNORMAL HIGH (ref 65–99)
Potassium: 4.5 mmol/L (ref 3.5–5.2)
Sodium: 138 mmol/L (ref 134–144)
Total Protein: 7 g/dL (ref 6.0–8.5)

## 2019-05-24 LAB — CBC WITH DIFFERENTIAL/PLATELET
Basophils Absolute: 0 10*3/uL (ref 0.0–0.2)
Basos: 0 %
EOS (ABSOLUTE): 0.3 10*3/uL (ref 0.0–0.4)
Eos: 4 %
Hematocrit: 41.1 % (ref 34.0–46.6)
Hemoglobin: 13.7 g/dL (ref 11.1–15.9)
Immature Grans (Abs): 0 10*3/uL (ref 0.0–0.1)
Immature Granulocytes: 0 %
Lymphocytes Absolute: 2.6 10*3/uL (ref 0.7–3.1)
Lymphs: 42 %
MCH: 31.5 pg (ref 26.6–33.0)
MCHC: 33.3 g/dL (ref 31.5–35.7)
MCV: 95 fL (ref 79–97)
Monocytes Absolute: 0.6 10*3/uL (ref 0.1–0.9)
Monocytes: 9 %
Neutrophils Absolute: 2.8 10*3/uL (ref 1.4–7.0)
Neutrophils: 45 %
Platelets: 185 10*3/uL (ref 150–450)
RBC: 4.35 x10E6/uL (ref 3.77–5.28)
RDW: 13 % (ref 11.7–15.4)
WBC: 6.3 10*3/uL (ref 3.4–10.8)

## 2019-05-24 LAB — VITAMIN B12: Vitamin B-12: 562 pg/mL (ref 232–1245)

## 2019-05-24 LAB — TSH: TSH: 0.96 u[IU]/mL (ref 0.450–4.500)

## 2019-05-24 MED ORDER — PHENTERMINE HCL 37.5 MG PO TABS: 37.5000 mg | ORAL_TABLET | Freq: Every day | ORAL | 2 refills | Status: DC

## 2019-05-24 NOTE — Telephone Encounter (Signed)
Prescription sent to pharmacy.

## 2019-07-17 ENCOUNTER — Telehealth (INDEPENDENT_AMBULATORY_CARE_PROVIDER_SITE_OTHER): Payer: BC Managed Care – PPO | Admitting: Family Medicine

## 2019-07-17 ENCOUNTER — Encounter: Payer: Self-pay | Admitting: Family Medicine

## 2019-07-17 DIAGNOSIS — J9801 Acute bronchospasm: Secondary | ICD-10-CM | POA: Diagnosis not present

## 2019-07-17 MED ORDER — AZITHROMYCIN 250 MG PO TABS: ORAL_TABLET | ORAL | 0 refills | Status: DC

## 2019-07-17 MED ORDER — ALBUTEROL SULFATE HFA 108 (90 BASE) MCG/ACT IN AERS: 2.0000 | INHALATION_SPRAY | Freq: Four times a day (QID) | RESPIRATORY_TRACT | 0 refills | Status: DC | PRN

## 2019-07-17 MED ORDER — BENZONATATE 100 MG PO CAPS: 100.0000 mg | ORAL_CAPSULE | Freq: Three times a day (TID) | ORAL | 0 refills | Status: DC | PRN

## 2019-07-17 MED ORDER — PREDNISONE 20 MG PO TABS: 40.0000 mg | ORAL_TABLET | Freq: Every day | ORAL | 0 refills | Status: AC

## 2019-07-17 NOTE — Progress Notes (Signed)
MyChart Video/ Telephone visit  Subjective: CC: cough PCP: Junie Spencer, FNP Kristi Holmes is a 39 y.o. female. Patient provides verbal consent for consult held via video/ phone.  Due to COVID-19 pandemic this visit was conducted virtually. This visit type was conducted due to national recommendations for restrictions regarding the COVID-19 Pandemic (e.g. social distancing, sheltering in place) in an effort to limit this patient's exposure and mitigate transmission in our community. All issues noted in this document were discussed and addressed.  A physical exam was not performed with this format.   Location of patient: car Location of provider: [facility] Others present for call: none  1. Cough She reports harsh cough x2 weeks.  She reports shortness of breath.  Symptoms are worse at night.  She reports dry cough.  She has been using mucinex with no improvement in symptoms.  She has not been tested for COVID.  No fever.  Her husband was tested 2 weeks ago and was negative.  Patient is an active smoker.  She used her father's breathing machine which did help.   ROS: Per HPI  No Known Allergies Past Medical History:  Diagnosis Date  . Chronic leg pain   . Chronic TMJ pain   . Varicose veins     Current Outpatient Medications:  .  phentermine (ADIPEX-P) 37.5 MG tablet, Take 1 tablet (37.5 mg total) by mouth daily before breakfast., Disp: 30 tablet, Rfl: 2  Assessment/ Plan: 39 y.o. female   1. Acute bronchospasm I am going empirically treat her for bacterial bronchitis with azithromycin given smoking status.  I have also added prednisone to help with bronchospasm.  Albuterol inhaler prescribed.  Instructions for use discussed with the patient.  Tessalon Perles sent.  We discussed red flag signs and symptoms warranting further evaluation.  Patient was good understanding will follow as needed - albuterol (VENTOLIN HFA) 108 (90 Base) MCG/ACT inhaler; Inhale 2 puffs into the lungs  every 6 (six) hours as needed for wheezing or shortness of breath.  Dispense: 8 g; Refill: 0 - predniSONE (DELTASONE) 20 MG tablet; Take 2 tablets (40 mg total) by mouth daily with breakfast for 5 days.  Dispense: 10 tablet; Refill: 0 - benzonatate (TESSALON PERLES) 100 MG capsule; Take 1 capsule (100 mg total) by mouth 3 (three) times daily as needed.  Dispense: 20 capsule; Refill: 0 - azithromycin (ZITHROMAX) 250 MG tablet; Take 2 tablets today, then take 1 tablet daily until gone.  Dispense: 6 tablet; Refill: 0   Start time: 6:15pm End time: 6:25pm  Total time spent on patient care (including video visit/ documentation): 20 minutes  Anju Sereno Hulen Skains, DO [facility] [phone]

## 2019-08-04 ENCOUNTER — Other Ambulatory Visit: Payer: Self-pay | Admitting: Family Medicine

## 2019-08-04 DIAGNOSIS — J9801 Acute bronchospasm: Secondary | ICD-10-CM

## 2019-08-21 ENCOUNTER — Ambulatory Visit: Payer: BC Managed Care – PPO | Admitting: Family

## 2019-08-21 ENCOUNTER — Other Ambulatory Visit: Payer: Self-pay

## 2019-08-21 ENCOUNTER — Encounter: Payer: Self-pay | Admitting: Family

## 2019-08-21 VITALS — BP 112/78 | HR 68 | Temp 98.1°F | Ht 69.0 in | Wt 226.4 lb

## 2019-08-21 DIAGNOSIS — E669 Obesity, unspecified: Secondary | ICD-10-CM

## 2019-08-21 DIAGNOSIS — Z713 Dietary counseling and surveillance: Secondary | ICD-10-CM

## 2019-08-21 NOTE — Patient Instructions (Signed)

## 2019-08-21 NOTE — Progress Notes (Signed)
   Subjective:    Patient ID: Kristi Holmes, female    DOB: November 17, 1980, 39 y.o.   MRN: [id number]  Chief Complaint  Patient presents with  . Weight Check    HPI Pt presents to the office today to check on weight loss. She was seen on 05/22/19 and we started her on phentermine. She reports she has gained 3 lbs since our last visit. She states she is constantly working outside, but does not do any scheduled exercising.   She reports she does not eat breakfast or lunch and reports eating a cereal at night. She states she does not snack or eat "junk food" or drink soft drinks.    Review of Systems  All other systems reviewed and are negative.      Objective:   Physical Exam Vitals reviewed.  Constitutional:      General: She is not in acute distress.    Appearance: She is well-developed.  HENT:     Head: Normocephalic and atraumatic.     Right Ear: External ear normal.  Eyes:     Pupils: Pupils are equal, round, and reactive to light.  Neck:     Thyroid: No thyromegaly.  Cardiovascular:     Rate and Rhythm: Normal rate and regular rhythm.     Heart sounds: Normal heart sounds. No murmur.  Pulmonary:     Effort: Pulmonary effort is normal. No respiratory distress.     Breath sounds: Normal breath sounds. No wheezing.  Abdominal:     General: Bowel sounds are normal. There is no distension.     Palpations: Abdomen is soft.     Tenderness: There is no abdominal tenderness.  Musculoskeletal:        General: No tenderness. Normal range of motion.     Cervical back: Normal range of motion and neck supple.  Skin:    General: Skin is warm and dry.  Neurological:     Mental Status: She is alert and oriented to person, place, and time.     Cranial Nerves: No cranial nerve deficit.     Deep Tendon Reflexes: Reflexes are normal and symmetric.  Psychiatric:        Behavior: Behavior normal.        Thought Content: Thought content normal.        Judgment: Judgment normal.        BP 112/78   Pulse 68   Temp 98.1 F (36.7 C) (Temporal)   Ht 5\' 9"  (1.753 m)   Wt 226 lb 6.4 oz (102.7 kg)   SpO2 98%   BMI 33.43 kg/m      Assessment & Plan:  Kristi Holmes comes in today with chief complaint of Weight Check   Diagnosis and orders addressed:  1. Weight loss counseling, encounter for - Amb Ref to Medical Weight Management  2. Obesity (BMI 30.0-34.9) - Amb Ref to Medical Weight Management   Will hold off on phentermine at this, will do referral to weight loss counseling, encourage healthy diet and scheduled exercising     08-14-1992, FNP

## 2019-09-07 ENCOUNTER — Other Ambulatory Visit: Payer: Self-pay | Admitting: Family Medicine

## 2019-09-07 DIAGNOSIS — J9801 Acute bronchospasm: Secondary | ICD-10-CM

## 2019-09-27 ENCOUNTER — Ambulatory Visit (INDEPENDENT_AMBULATORY_CARE_PROVIDER_SITE_OTHER): Payer: BC Managed Care – PPO | Admitting: Family

## 2019-09-27 ENCOUNTER — Encounter: Payer: Self-pay | Admitting: Family

## 2019-09-27 DIAGNOSIS — R0602 Shortness of breath: Secondary | ICD-10-CM

## 2019-09-27 DIAGNOSIS — R609 Edema, unspecified: Secondary | ICD-10-CM | POA: Diagnosis not present

## 2019-09-27 DIAGNOSIS — F17200 Nicotine dependence, unspecified, uncomplicated: Secondary | ICD-10-CM | POA: Diagnosis not present

## 2019-09-27 DIAGNOSIS — R635 Abnormal weight gain: Secondary | ICD-10-CM

## 2019-09-27 NOTE — Progress Notes (Signed)
   Virtual Visit via telephone Note Due to COVID-19 pandemic this visit was conducted virtually. This visit type was conducted due to national recommendations for restrictions regarding the COVID-19 Pandemic (e.g. social distancing, sheltering in place) in an effort to limit this patient's exposure and mitigate transmission in our community. All issues noted in this document were discussed and addressed.  A physical exam was not performed with this format.  I connected with Kristi Holmes on 09/27/19 at 10:37 AM  by telephone and verified that I am speaking with the correct person using two identifiers. Kristi Holmes is currently located at work and no one is currently with her during visit. The provider, Evelina Dun, FNP is located in their office at time of visit.  I discussed the limitations, risks, security and privacy concerns of performing an evaluation and management service by telephone and the availability of in person appointments. I also discussed with the patient that there may be a patient responsible charge related to this service. The patient expressed understanding and agreed to proceed.   History and Present Illness:  HPI Pt calls the office today with bilateral feet and ankle that started over a month ago. She reports the swelling is constantly, but does slightly improve in the morning after she has had them elevated. She reports they are tender. Denies any fever or erythemas.   She reports she has mild SOB with exertion.    She has gained 80 lb in 8 month. She reports she has not changed any of diet or activity. She reports she only eats a bowl of cereal at night.   Review of Systems  All other systems reviewed and are negative.    Observations/Objective: No SOB or distress noted  Assessment and Plan: 1. Peripheral edema - Ambulatory referral to Vascular Surgery - ECHOCARDIOGRAM COMPLETE; Future - BMP8+EGFR - Brain natriuretic peptide  2. Weight gain -  Ambulatory referral to Vascular Surgery - ECHOCARDIOGRAM COMPLETE; Future - BMP8+EGFR - Brain natriuretic peptide  3. SOB (shortness of breath) on exertion - Ambulatory referral to Vascular Surgery - ECHOCARDIOGRAM COMPLETE; Future - BMP8+EGFR - Brain natriuretic peptide  4. Smoker      I discussed the assessment and treatment plan with the patient. The patient was provided an opportunity to ask questions and all were answered. The patient agreed with the plan and demonstrated an understanding of the instructions.   The patient was advised to call back or seek an in-person evaluation if the symptoms worsen or if the condition fails to improve as anticipated.  The above assessment and management plan was discussed with the patient. The patient verbalized understanding of and has agreed to the management plan. Patient is aware to call the clinic if symptoms persist or worsen. Patient is aware when to return to the clinic for a follow-up visit. Patient educated on when it is appropriate to go to the emergency department.   Time call ended:  11:00 AM  I provided 23 minutes of non-face-to-face time during this encounter.    Evelina Dun, FNP

## 2019-10-01 ENCOUNTER — Emergency Department (HOSPITAL_BASED_OUTPATIENT_CLINIC_OR_DEPARTMENT_OTHER)
Admission: EM | Admit: 2019-10-01 | Discharge: 2019-10-01 | Disposition: A | Discharge status: Home / Self Care | Payer: BLUE CROSS/BLUE SHIELD | Attending: Emergency Medicine | Admitting: Emergency Medicine

## 2019-10-01 ENCOUNTER — Other Ambulatory Visit: Payer: Self-pay

## 2019-10-01 ENCOUNTER — Emergency Department (HOSPITAL_BASED_OUTPATIENT_CLINIC_OR_DEPARTMENT_OTHER): Payer: BLUE CROSS/BLUE SHIELD

## 2019-10-01 ENCOUNTER — Encounter (HOSPITAL_BASED_OUTPATIENT_CLINIC_OR_DEPARTMENT_OTHER): Payer: Self-pay | Admitting: *Deleted

## 2019-10-01 DIAGNOSIS — M7989 Other specified soft tissue disorders: Secondary | ICD-10-CM

## 2019-10-01 DIAGNOSIS — R2243 Localized swelling, mass and lump, lower limb, bilateral: Secondary | ICD-10-CM | POA: Insufficient documentation

## 2019-10-01 DIAGNOSIS — F17210 Nicotine dependence, cigarettes, uncomplicated: Secondary | ICD-10-CM | POA: Diagnosis not present

## 2019-10-01 DIAGNOSIS — I509 Heart failure, unspecified: Secondary | ICD-10-CM | POA: Insufficient documentation

## 2019-10-01 DIAGNOSIS — I517 Cardiomegaly: Secondary | ICD-10-CM | POA: Diagnosis not present

## 2019-10-01 LAB — URINALYSIS, ROUTINE W REFLEX MICROSCOPIC
Bilirubin Urine: NEGATIVE
Glucose, UA: NEGATIVE mg/dL
Hgb urine dipstick: NEGATIVE
Ketones, ur: NEGATIVE mg/dL
Leukocytes,Ua: NEGATIVE
Nitrite: NEGATIVE
Protein, ur: NEGATIVE mg/dL
Specific Gravity, Urine: >1.03 — ABNORMAL HIGH (ref 1.005–1.030)
pH: 6 (ref 5.0–8.0)

## 2019-10-01 LAB — BASIC METABOLIC PANEL
Anion gap: 8 (ref 5–15)
BUN: 9 mg/dL (ref 6–20)
CO2: 25 mmol/L (ref 22–32)
Calcium: 8.4 mg/dL — ABNORMAL LOW (ref 8.9–10.3)
Chloride: 103 mmol/L (ref 98–111)
Creatinine, Ser: 0.82 mg/dL (ref 0.44–1.00)
GFR calc Af Amer: >60 mL/min (ref >60)
GFR calc non Af Amer: >60 mL/min (ref >60)
Glucose, Bld: 106 mg/dL — ABNORMAL HIGH (ref 70–99)
Potassium: 3.8 mmol/L (ref 3.5–5.1)
Sodium: 136 mmol/L (ref 135–145)

## 2019-10-01 LAB — BRAIN NATRIURETIC PEPTIDE: B Natriuretic Peptide: 35.5 pg/mL (ref 0.0–100.0)

## 2019-10-01 MED ORDER — FUROSEMIDE 20 MG PO TABS: 20.0000 mg | ORAL_TABLET | Freq: Every day | ORAL | 0 refills | Status: DC | PRN

## 2019-10-01 MED ORDER — POTASSIUM CHLORIDE ER 10 MEQ PO TBCR: 10.0000 meq | EXTENDED_RELEASE_TABLET | Freq: Every day | ORAL | 0 refills | Status: DC

## 2019-10-01 NOTE — ED Triage Notes (Signed)
BLE swelling since March

## 2019-10-01 NOTE — ED Provider Notes (Signed)
[facility] EMERGENCY DEPARTMENT Provider Note   CSN: [id number] Arrival date & time: 10/01/19  8588     History Chief Complaint  Patient presents with  . Leg Swelling    Kristi Holmes is a 39 y.o. female with no significant past medical history presented to emergency department with bilateral leg swelling shortness of breath.  Patient reports her legs been swelling for several months.  She reports that in the past year she has put on about 80 pounds in weight, despite eating very little.  She also reports that she feels like she has had more dyspnea on exertion than usual over the past few months.  She is typically extremely active.  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  She denies ever having chest pain with this.  She denies orthopnea.  She denies any personal or family history of congestive heart failure.  She denies any recent pregnancies.  She was seen by her PCP last week who had ordered her a BMP and a BNP test as an outpatient, which she hasn't yet had done.  She also had an echocardiogram ordered but says the soonest that can happen is in September.  She is concerned about the degree of leg swelling that is continuing to occur.  She has not been started on diuretics or any medical therapy for this.  HPI     Past Medical History:  Diagnosis Date  . Chronic leg pain   . Chronic TMJ pain   . Varicose veins     Patient Active Problem List   Diagnosis Date Noted  . Current smoker 05/22/2019  . Opioid abuse, continuous use (HCC) 11/13/2015  . Varicose veins of leg with complications 10/21/2014  . TMJ (dislocation of temporomandibular joint) 05/14/2013    Past Surgical History:  Procedure Laterality Date  . FINGER SURGERY    . TUBAL LIGATION       OB History    Gravida  3   Para  2   Term      Preterm      AB  1   Living        SAB      TAB      Ectopic      Multiple      Live Births              History reviewed. No  pertinent family history.  Social History   Tobacco Use  . Smoking status: Current Every Day Smoker    Packs/day: 1.00    Types: Cigarettes  . Smokeless tobacco: Never Used  Substance Use Topics  . Alcohol use: No    Alcohol/week: 0.0 standard drinks  . Drug use: No    Home Medications Prior to Admission medications   Medication Sig Start Date End Date Taking? Authorizing Provider  furosemide (LASIX) 20 MG tablet Take 1 tablet (20 mg total) by mouth daily as needed for edema. 10/01/19 10/31/19  Terald Sleeper, MD  potassium chloride (KLOR-CON) 10 MEQ tablet Take 1 tablet (10 mEq total) by mouth daily. 10/01/19   Terald Sleeper, MD    Allergies    Patient has no known allergies.  Review of Systems   Review of Systems  Constitutional: Positive for unexpected weight change. Negative for chills and fever.  Eyes: Negative for pain and visual disturbance.  Respiratory: Positive for shortness of breath. Negative for cough.   Cardiovascular: Positive for leg swelling. Negative for chest pain and  palpitations.  Gastrointestinal: Negative for abdominal pain and vomiting.  Genitourinary: Negative for dysuria and hematuria.  Musculoskeletal: Positive for arthralgias and myalgias.  Skin: Negative for color change and rash.  Neurological: Negative for syncope and weakness.  Psychiatric/Behavioral: Negative for agitation and confusion.  All other systems reviewed and are negative.   Physical Exam Updated Vital Signs BP 121/86   Pulse 74   Temp 97.6 F (36.4 C) (Oral)   Resp 16   Ht 5' 9.5" (1.765 m)   Wt 108.3 kg   LMP 09/11/2019   SpO2 98%   BMI 34.75 kg/m   Physical Exam Vitals and nursing note reviewed.  Constitutional:      General: She is not in acute distress.    Appearance: She is well-developed.  HENT:     Head: Normocephalic and atraumatic.  Eyes:     Conjunctiva/sclera: Conjunctivae normal.  Cardiovascular:     Rate and Rhythm: Normal rate and regular  rhythm.     Pulses: Normal pulses.     Heart sounds: No murmur heard.   Pulmonary:     Effort: Pulmonary effort is normal. No respiratory distress.     Breath sounds: Normal breath sounds.  Abdominal:     Palpations: Abdomen is soft.     Tenderness: There is no abdominal tenderness.  Musculoskeletal:     Cervical back: Neck supple.     Right lower leg: Edema present.     Left lower leg: Edema present.  Skin:    General: Skin is warm and dry.  Neurological:     General: No focal deficit present.     Mental Status: She is alert and oriented to person, place, and time.  Psychiatric:        Mood and Affect: Mood normal.        Behavior: Behavior normal.     ED Results / Procedures / Treatments   Labs (all labs ordered are listed, but only abnormal results are displayed) Labs Reviewed  BASIC METABOLIC PANEL - Abnormal; Notable for the following components:      Result Value   Glucose, Bld 106 (*)    Calcium 8.4 (*)    All other components within normal limits  URINALYSIS, ROUTINE W REFLEX MICROSCOPIC - Abnormal; Notable for the following components:   Specific Gravity, Urine >1.030 (*)    All other components within normal limits  BRAIN NATRIURETIC PEPTIDE    EKG None  Radiology DG Chest Portable 1 View  Result Date: 10/01/2019 CLINICAL DATA:  39 year old with bilateral lower extremity swelling. Evaluate for cardiomegaly. EXAM: PORTABLE CHEST 1 VIEW COMPARISON:  None. FINDINGS: Lungs are clear. Heart and mediastinum are within normal limits. Trachea is midline. Negative for a pneumothorax. Density in the upper mediastinum near the right clavicle head is likely related to the sternum but has an unusual configuration. IMPRESSION: No acute chest abnormality.  No evidence for cardiomegaly. Density in the upper mediastinum is presumably related to the sternum. Configuration is slightly unusual and consider clinical correlation in this area. Consider further characterization with a  PA and lateral view of the chest. Electronically Signed   By: Richarda Overlie M.D.   On: 10/01/2019 09:27    Procedures Procedures (including critical care time)  Medications Ordered in ED Medications - No data to display  ED Course  I have reviewed the triage vital signs and the nursing notes.  Pertinent labs & imaging results that were available during my care of the patient were  reviewed by me and considered in my medical decision making (see chart for details).  39 yo female here with bilateral LE edema for months, worsening in past few days PCP is working up for possible CHF  Ddx includes lymphaedema vs venous insufficiency vs CHF vs kidney disease vs other  She states her PCP has already checked her thyroid function and was told it's normal  Today on exam she doesn't have pulmonary edema and appears comfortable, but I think checking BNP is reasonable here.  We'll also check her kidney function with Cr and UA here.    Labs personally reviewed with normal BNP, normal creatinine, no protein in UA.  She'll need to f/u with cards or her PCP for an outpatient echo, as I do not think this is emergently indicated.  We'll start her on lasix 20 mg daily for a few days to see if that helps her edema, as well as supplemental K+ while taking lasix, and she can f/u again with her PCP's office.      Final Clinical Impression(s) / ED Diagnoses Final diagnoses:  Leg swelling    Rx / DC Orders ED Discharge Orders         Ordered    furosemide (LASIX) 20 MG tablet  Daily PRN     Discontinue  Reprint     10/01/19 1011    potassium chloride (KLOR-CON) 10 MEQ tablet  Daily     Discontinue  Reprint     10/01/19 1011           Wyvonnia Dusky, MD 10/01/19 1712

## 2019-10-01 NOTE — Discharge Instructions (Signed)
Consider compression stockings for your legs.  You can take lasix AS NEEDED for several days to see if this helps with your leg swelling (edema).  You do not need to continue taking this if you feel the water/edema has improved.    Take potassium supplements while taking lasix.

## 2019-10-02 ENCOUNTER — Other Ambulatory Visit: Payer: Self-pay | Admitting: Family

## 2019-10-02 DIAGNOSIS — R5383 Other fatigue: Secondary | ICD-10-CM

## 2019-10-02 DIAGNOSIS — R609 Edema, unspecified: Secondary | ICD-10-CM

## 2019-10-04 ENCOUNTER — Other Ambulatory Visit: Payer: BC Managed Care – PPO

## 2019-10-04 ENCOUNTER — Other Ambulatory Visit: Payer: Self-pay

## 2019-10-04 DIAGNOSIS — R5383 Other fatigue: Secondary | ICD-10-CM

## 2019-10-04 DIAGNOSIS — R609 Edema, unspecified: Secondary | ICD-10-CM

## 2019-10-05 ENCOUNTER — Other Ambulatory Visit: Payer: Self-pay | Admitting: Family

## 2019-10-05 LAB — CBC WITH DIFFERENTIAL/PLATELET
Basophils Absolute: 0 10*3/uL (ref 0.0–0.2)
Basos: 1 %
EOS (ABSOLUTE): 0.6 10*3/uL — ABNORMAL HIGH (ref 0.0–0.4)
Eos: 10 %
Hematocrit: 41.7 % (ref 34.0–46.6)
Hemoglobin: 13.7 g/dL (ref 11.1–15.9)
Immature Grans (Abs): 0 10*3/uL (ref 0.0–0.1)
Immature Granulocytes: 0 %
Lymphocytes Absolute: 2.5 10*3/uL (ref 0.7–3.1)
Lymphs: 40 %
MCH: 30.6 pg (ref 26.6–33.0)
MCHC: 32.9 g/dL (ref 31.5–35.7)
MCV: 93 fL (ref 79–97)
Monocytes Absolute: 0.7 10*3/uL (ref 0.1–0.9)
Monocytes: 11 %
Neutrophils Absolute: 2.4 10*3/uL (ref 1.4–7.0)
Neutrophils: 38 %
Platelets: 174 10*3/uL (ref 150–450)
RBC: 4.47 x10E6/uL (ref 3.77–5.28)
RDW: 13.3 % (ref 11.7–15.4)
WBC: 6.2 10*3/uL (ref 3.4–10.8)

## 2019-10-05 LAB — ANA: ANA Titer 1: NEGATIVE

## 2019-10-05 LAB — THYROID PANEL WITH TSH
Free Thyroxine Index: 1.4 (ref 1.2–4.9)
T3 Uptake Ratio: 23 % — ABNORMAL LOW (ref 24–39)
T4, Total: 6.3 ug/dL (ref 4.5–12.0)
TSH: 0.862 u[IU]/mL (ref 0.450–4.500)

## 2019-10-05 LAB — C-REACTIVE PROTEIN: CRP: 5 mg/L (ref 0–10)

## 2019-10-05 LAB — SEDIMENTATION RATE: Sed Rate: 35 mm/hr — ABNORMAL HIGH (ref 0–32)

## 2019-10-12 ENCOUNTER — Ambulatory Visit (HOSPITAL_COMMUNITY)
Admission: RE | Admit: 2019-10-12 | Discharge: 2019-10-12 | Disposition: A | Discharge status: Home / Self Care | Payer: BLUE CROSS/BLUE SHIELD | Source: Ambulatory Visit | Attending: Family | Admitting: Family

## 2019-10-12 ENCOUNTER — Other Ambulatory Visit: Payer: Self-pay

## 2019-10-12 DIAGNOSIS — R609 Edema, unspecified: Secondary | ICD-10-CM | POA: Diagnosis not present

## 2019-10-12 DIAGNOSIS — R635 Abnormal weight gain: Secondary | ICD-10-CM | POA: Diagnosis not present

## 2019-10-12 DIAGNOSIS — R0602 Shortness of breath: Secondary | ICD-10-CM | POA: Insufficient documentation

## 2019-10-12 NOTE — Progress Notes (Signed)
*  PRELIMINARY RESULTS* Echocardiogram 2D Echocardiogram has been performed.  Stacey Drain 10/12/2019, 12:49 PM

## 2019-10-25 ENCOUNTER — Other Ambulatory Visit: Payer: Self-pay | Admitting: *Deleted

## 2019-10-25 ENCOUNTER — Other Ambulatory Visit: Payer: Self-pay

## 2019-10-25 DIAGNOSIS — I83899 Varicose veins of unspecified lower extremities with other complications: Secondary | ICD-10-CM

## 2019-10-25 NOTE — Progress Notes (Signed)
Opened in Error.

## 2019-10-30 ENCOUNTER — Other Ambulatory Visit: Payer: Self-pay | Admitting: Family

## 2019-10-30 MED ORDER — FUROSEMIDE 20 MG PO TABS: 20.0000 mg | ORAL_TABLET | Freq: Every day | ORAL | 2 refills | Status: DC | PRN

## 2019-10-31 NOTE — Telephone Encounter (Signed)
Good morning,  I looked in patient's chart and do not see a referral for kidney doctor.  Please review and advise if patient needs referral - thanks

## 2019-11-01 ENCOUNTER — Ambulatory Visit (HOSPITAL_COMMUNITY)
Admission: RE | Admit: 2019-11-01 | Discharge: 2019-11-01 | Disposition: A | Discharge status: Home / Self Care | Payer: BC Managed Care – PPO | Source: Ambulatory Visit | Attending: Vascular Surgery | Admitting: Vascular Surgery

## 2019-11-01 ENCOUNTER — Ambulatory Visit (INDEPENDENT_AMBULATORY_CARE_PROVIDER_SITE_OTHER): Payer: Self-pay | Admitting: Physician Assistant

## 2019-11-01 ENCOUNTER — Other Ambulatory Visit: Payer: Self-pay

## 2019-11-01 ENCOUNTER — Other Ambulatory Visit: Payer: Self-pay | Admitting: Family

## 2019-11-01 VITALS — BP 115/82 | HR 81 | Temp 98.2°F | Resp 20 | Ht 69.0 in | Wt 242.7 lb

## 2019-11-01 DIAGNOSIS — I83899 Varicose veins of unspecified lower extremities with other complications: Secondary | ICD-10-CM | POA: Diagnosis not present

## 2019-11-01 DIAGNOSIS — R609 Edema, unspecified: Secondary | ICD-10-CM

## 2019-11-01 DIAGNOSIS — I872 Venous insufficiency (chronic) (peripheral): Secondary | ICD-10-CM

## 2019-11-01 DIAGNOSIS — R600 Localized edema: Secondary | ICD-10-CM

## 2019-11-01 NOTE — Progress Notes (Signed)
Requested by:  Junie Spencer, FNP [address] [place],  [place] [address]  Reason for consultation: Bilateral lower extremity edema   History of Present Illness   Kristi Holmes is a 39 y.o. (July 15, 1980) female who presents for evaluation of bilateral lower extremity edema times approximately 3 to 4 months.  Patient states she has had recent significant weight gain of approximately 60 pounds with onset of lower extremity edema.  The emergency department on October 01, 2019 secondary to bilateral leg swelling.  More BNP, creatinine and no protein in her urine.  She denies shortness of breath, fever, chills or chest pain.  Primary care provider continued her Lasix therapy 20 mg daily.  Venous symptoms include: Bilateral aching, swelling.  Denies itching, burning, bleeding or ulceration Onset/duration: 3 to 4 months Occupation: Print production planner Aggravating factors: Sitting Alleviating factors: Elevation Compression: Not used helps:   Pain medications: None Previous vein procedures: None History of DVT: No  Past Medical History:  Diagnosis Date  . Chronic leg pain   . Chronic TMJ pain   . Varicose veins     Past Surgical History:  Procedure Laterality Date  . FINGER SURGERY    . TUBAL LIGATION      Social History   Socioeconomic History  . Marital status: Married    Spouse name: Not on file  . Number of children: 2  . Years of education: Not on file  . Highest education level: Not on file  Occupational History  . Not on file  Tobacco Use  . Smoking status: Current Every Day Smoker    Packs/day: 1.00    Types: Cigarettes  . Smokeless tobacco: Never Used  Substance and Sexual Activity  . Alcohol use: No    Alcohol/week: 0.0 standard drinks  . Drug use: No  . Sexual activity: Yes  Other Topics Concern  . Not on file  Social History Narrative  . Not on file   Social Determinants of Health   Financial Resource Strain:   . Difficulty of Paying Living  Expenses:   Food Insecurity:   . Worried About Programme researcher, broadcasting/film/video in the Last Year:   . Barista in the Last Year:   Transportation Needs:   . Freight forwarder (Medical):   Marland Kitchen Lack of Transportation (Non-Medical):   Physical Activity:   . Days of Exercise per Week:   . Minutes of Exercise per Session:   Stress:   . Feeling of Stress :   Social Connections:   . Frequency of Communication with Friends and Family:   . Frequency of Social Gatherings with Friends and Family:   . Attends Religious Services:   . Active Member of Clubs or Organizations:   . Attends Banker Meetings:   Marland Kitchen Marital Status:   Intimate Partner Violence:   . Fear of Current or Ex-Partner:   . Emotionally Abused:   Marland Kitchen Physically Abused:   . Sexually Abused:    History reviewed. No pertinent family history.  Current Outpatient Medications  Medication Sig Dispense Refill  . furosemide (LASIX) 20 MG tablet Take 1 tablet (20 mg total) by mouth daily as needed for edema. 30 tablet 2  . potassium chloride (KLOR-CON) 10 MEQ tablet Take 1 tablet (10 mEq total) by mouth daily. 60 tablet 0   No current facility-administered medications for this visit.    No Known Allergies  REVIEW OF SYSTEMS (negative unless checked):   Cardiac:  []   Chest pain or chest pressure? []  Shortness of breath upon activity? []  Shortness of breath when lying flat? []  Irregular heart rhythm?  Vascular:  []  Pain in calf, thigh, or hip brought on by walking? []  Pain in feet at night that wakes you up from your sleep? []  Blood clot in your veins? [x]  Leg swelling?  Pulmonary:  []  Oxygen at home? []  Productive cough? []  Wheezing?  Neurologic:  []  Sudden weakness in arms or legs? []  Sudden numbness in arms or legs? []  Sudden onset of difficult speaking or slurred speech? []  Temporary loss of vision in one eye? []  Problems with dizziness?  Gastrointestinal:  []  Blood in stool? []  Vomited  blood?  Genitourinary:  []  Burning when urinating? []  Blood in urine?  Psychiatric:  []  Major depression  Hematologic:  []  Bleeding problems? []  Problems with blood clotting?  Dermatologic:  []  Rashes or ulcers?  Constitutional:  []  Fever or chills?  Ear/Nose/Throat:  []  Change in hearing? []  Nose bleeds? []  Sore throat?  Musculoskeletal:  []  Back pain? []  Joint pain? []  Muscle pain?   Physical Examination     Vitals:   11/01/19 1459  BP: 115/82  Pulse: 81  Resp: 20  Temp: 98.2 F (36.8 C)  TempSrc: Temporal  SpO2: 94%  Weight: (!) 242 lb 11.2 oz (110.1 kg)  Height: 5\' 9"  (1.753 m)   Body mass index is 35.84 kg/m.  General:  WDWN in NAD; vital signs documented above Gait: Normal unaided HENT: WNL, normocephalic Pulmonary: normal non-labored breathing , without Rales, rhonchi,  wheezing Cardiac: regular HR, without  Murmurs without carotid bruits Abdomen: soft, NT, no masses Skin: without rashes Vascular Exam/Pulses:  Right Left  Radial 2+ (normal) 2+ (normal)  Ulnar  not evaluated  not evaluated  Femoral  not palpable  not palpable  Popliteal  not palpable  not palpable  DP 2+ (normal) 2+ (normal)  PT 2+ (normal) 2+ (normal)   Extremities: without varicose veins, without reticular veins, with edema, without stasis pigmentation, without lipodermatosclerosis, without ulcers Musculoskeletal: no muscle wasting or atrophy  Neurologic: A&O X 3;  No focal weakness or paresthesias are detected Psychiatric:  The pt has Normal affect.  Non-invasive Vascular Imaging   BLE Venous Insufficiency Duplex (11/01/2019):   RLE:   no DVT and SVT,   Positive GSV reflux at [facility] J and proximal and mid thigh,  GSV diameter 0.6 to 0.8 cm  No SSV reflux ,  No deep venous reflux   LLE:  No DVT and SVT,   Positive GSV reflux at [facility] J and proximal mid and distal thigh,   GSV diameter 0.5 to 0.9 cm  No SSV reflux ,  Positive deep venous  reflux   Medical Decision Making   Kristi Holmes is a 39 y.o. female who presents with: Bilateral lower extremity edema associated with recent weight gain.  She has evidence of greater saphenous vein reflux.  Based on the patient's history and examination, I recommend: Weight loss and regular elevation of both lower extremities above the heart level.  I discussed with the patient the use of her 20-30 mm thigh high compression stockings and need for 3 month trial of such.  The patient will follow up in 3 months with Dr. or .  Thank you for allowing to participate in this patient's care.   , PA-C [facility] Office: [phone]  11/01/2019, 3:03 PM  Clinic MD: 

## 2019-12-03 MED ORDER — POTASSIUM CHLORIDE ER 10 MEQ PO TBCR: 10.0000 meq | EXTENDED_RELEASE_TABLET | Freq: Every day | ORAL | 0 refills | Status: DC

## 2020-01-01 DIAGNOSIS — R609 Edema, unspecified: Secondary | ICD-10-CM | POA: Diagnosis not present

## 2020-01-01 DIAGNOSIS — Z720 Tobacco use: Secondary | ICD-10-CM | POA: Diagnosis not present

## 2020-02-01 ENCOUNTER — Other Ambulatory Visit: Payer: Self-pay | Admitting: Family

## 2020-02-07 ENCOUNTER — Encounter: Payer: Self-pay | Admitting: Vascular Surgery

## 2020-02-07 ENCOUNTER — Ambulatory Visit: Payer: BC Managed Care – PPO | Admitting: Vascular Surgery

## 2020-02-07 ENCOUNTER — Other Ambulatory Visit: Payer: Self-pay

## 2020-02-07 VITALS — BP 131/93 | HR 88 | Temp 98.2°F | Resp 18 | Ht 69.0 in | Wt 244.3 lb

## 2020-02-07 DIAGNOSIS — I872 Venous insufficiency (chronic) (peripheral): Secondary | ICD-10-CM

## 2020-02-07 DIAGNOSIS — R600 Localized edema: Secondary | ICD-10-CM | POA: Diagnosis not present

## 2020-02-07 NOTE — Progress Notes (Signed)
REASON FOR VISIT:   Follow-up of venous insufficiency  MEDICAL ISSUES:   CHRONIC VENOUS INSUFFICIENCY: This patient does have deep venous reflux on the left and superficial venous reflux on the left.  She has superficial venous reflux only on the right.  She has CEAP C1 venous disease.  I do not think that all of her swelling can be attributed to her chronic venous insufficiency.  She has a job where she is required to sit all day.  In addition her weight is contributing to her lower extremity edema.  We have discussed the importance of intermittent leg elevation and the proper positioning for this.  In addition I have encouraged her to continue to wear her compression stockings.  We discussed importance of exercise specifically walking and water aerobics.  I have encouraged her to avoid prolonged sitting and standing.  In addition I discussed importance of maintaining a healthy weight as central obesity especially increases lower extremity venous pressure.  If she develops more prominent varicose veins, worsening edema, or worsening symptoms I think she could be reevaluated for possible laser ablation of the left great saphenous vein and possible the right great saphenous vein.  However currently I do not think the amount of reflux that I am seeing by duplex can explain all of her swelling.  She will call if her symptoms progress.   HPI:   Kristi Holmes is a pleasant 39 y.o. female who was seen by Wendi Maya, PA on 11/01/2019 with bilateral lower extremity edema.  At that time was recommended that she elevate her legs, wear thigh-high compression stockings with a gradient of 20 to 30 mmHg take ibuprofen as needed for pain.  She comes in for a 65-month follow-up visit.  Her main complaint is bilateral lower extremity swelling.  This has been going on for approximately 6 to 7 months.  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  I suspect that some of the swelling began when she started sitting instead of being outside working.  She describes some aching pain and heaviness in her legs which is aggravated by standing and sitting relieved with elevation.  Her symptoms are worse at the end of the day.  She denies any previous history of DVT.  She has had no previous venous procedures.  She has been wearing her compression stockings and elevating her legs.  Past Medical History:  Diagnosis Date  . Chronic leg pain   . Chronic TMJ pain   . Varicose veins     No family history on file.  SOCIAL HISTORY: Social History   Tobacco Use  . Smoking status: Current Every Day Smoker    Packs/day: 1.00    Types: Cigarettes  . Smokeless tobacco: Never Used  Substance Use Topics  . Alcohol use: No    Alcohol/week: 0.0 standard drinks    No Known Allergies  Current Outpatient Medications  Medication Sig Dispense Refill  . furosemide (LASIX) 20 MG tablet Take 1 tablet (20 mg total) by mouth daily as needed for edema. 30 tablet 2  . potassium chloride (KLOR-CON) 10 MEQ tablet Take 1 tablet (10 mEq total) by mouth daily. 60 tablet 0   No current facility-administered medications for this visit.    REVIEW OF SYSTEMS:  [X]  denotes positive finding, [ ]  denotes negative finding Cardiac  Comments:  Chest pain or chest pressure:    Shortness of breath upon exertion:    Short of breath when lying flat:    Irregular heart rhythm:        Vascular    Pain in calf, thigh, or hip brought on by ambulation: x   Pain in feet at night that wakes you up from your sleep:  x   Blood clot in your veins:    Leg swelling:  x       Pulmonary    Oxygen at home:    Productive cough:     Wheezing:         Neurologic    Sudden weakness in arms or legs:     Sudden numbness in arms or legs:     Sudden onset of difficulty speaking or slurred speech:    Temporary loss of vision in one  eye:     Problems with dizziness:         Gastrointestinal    Blood in stool:     Vomited blood:         Genitourinary    Burning when urinating:     Blood in urine:        Psychiatric    Major depression:         Hematologic    Bleeding problems:    Problems with blood clotting too easily:        Skin    Rashes or ulcers:        Constitutional    Fever or chills:     PHYSICAL EXAM:   There were no vitals filed for this visit.  GENERAL: The patient is a well-nourished female, in no acute distress. The vital signs are documented above. CARDIAC: There is a regular rate and rhythm.  VASCULAR: I do not detect carotid bruits. She has palpable pedal pulses. She has some spider veins and reticular veins in her posterior legs as documented in the photograph below.   I did look at both great saphenous veins myself with the [employer].  The veins were moderately dilated. PULMONARY: There is good air exchange bilaterally without wheezing or rales. ABDOMEN: Soft and non-tender with normal pitched bowel sounds.  MUSCULOSKELETAL: There are no major deformities or cyanosis. NEUROLOGIC: No focal weakness or paresthesias are detected. SKIN: There are no ulcers or rashes noted. PSYCHIATRIC: The patient has a normal affect.  DATA:    VENOUS DUPLEX: I reviewed her previous venous duplex scan.  On the right side there was no evidence of DVT or superficial venous thrombosis.  There was no deep venous reflux.  There was superficial venous reflux from the saphenofemoral junction to the mid thigh.  Diameters of the vein ranged from 0.63-0.78 cm.  On the left side there was no evidence of DVT or superficial venous thrombosis.  There was deep venous reflux involving the common femoral vein and femoral vein.  There was superficial venous reflux involving the left great saphenous vein from the saphenofemoral junction to the distal thigh.  Diameters of the vein ranged from 0.58-0.71 cm.  A total  of 30 minutes was spent on this visit. 15 minutes was face to face time. More than 50% of the time was spent on counseling and coordinating with the patient.    Waverly Ferrari [facility] [phone]

## 2020-02-14 ENCOUNTER — Other Ambulatory Visit: Payer: Self-pay | Admitting: Family

## 2020-06-24 ENCOUNTER — Other Ambulatory Visit: Payer: Self-pay

## 2020-06-24 ENCOUNTER — Telehealth: Payer: Self-pay | Admitting: Family Medicine

## 2020-06-24 ENCOUNTER — Ambulatory Visit: Payer: BC Managed Care – PPO | Admitting: Family Medicine

## 2020-06-24 ENCOUNTER — Encounter: Payer: Self-pay | Admitting: Family Medicine

## 2020-06-24 VITALS — BP 131/87 | HR 71 | Temp 97.8°F | Ht 69.0 in | Wt 234.2 lb

## 2020-06-24 DIAGNOSIS — E669 Obesity, unspecified: Secondary | ICD-10-CM | POA: Diagnosis not present

## 2020-06-24 NOTE — Progress Notes (Signed)
Established Patient Office Visit  Subjective:  Patient ID: Kristi Holmes, female    DOB: 1981-03-23  Age: 40 y.o. MRN: [id number]  CC:  Chief Complaint  Patient presents with  . Obesity    HPI Kristi Holmes presents for obesity. She was previously on phentermine about a year ago and would like to try this again to assist with weight loss. Her PCP is [employer].   Past Medical History:  Diagnosis Date  . Chronic leg pain   . Chronic TMJ pain   . Varicose veins     Past Surgical History:  Procedure Laterality Date  . FINGER SURGERY    . TUBAL LIGATION      No family history on file.  Social History   Socioeconomic History  . Marital status: Married    Spouse name: Not on file  . Number of children: 2  . Years of education: Not on file  . Highest education level: Not on file  Occupational History  . Not on file  Tobacco Use  . Smoking status: Current Every Day Smoker    Packs/day: 1.00    Types: Cigarettes  . Smokeless tobacco: Never Used  Substance and Sexual Activity  . Alcohol use: No    Alcohol/week: 0.0 standard drinks  . Drug use: No  . Sexual activity: Yes  Other Topics Concern  . Not on file  Social History Narrative  . Not on file   Social Determinants of Health   Financial Resource Strain: Not on file  Food Insecurity: Not on file  Transportation Needs: Not on file  Physical Activity: Not on file  Stress: Not on file  Social Connections: Not on file  Intimate Partner Violence: Not on file    Outpatient Medications Prior to Visit  Medication Sig Dispense Refill  . Buprenorphine HCl-Naloxone HCl 8-2 MG FILM Place under the tongue daily.    . furosemide (LASIX) 20 MG tablet Take 1 tablet (20 mg total) by mouth daily as needed for edema. 30 tablet 2  . potassium chloride (KLOR-CON) 10 MEQ tablet TAKE 1 TABLET BY MOUTH EVERY DAY 60 tablet 0   No facility-administered medications prior to visit.    No Known  Allergies  ROS Review of Systems As per HPI.   Objective:    Physical Exam Vitals and nursing note reviewed.  Constitutional:      General: She is not in acute distress.    Appearance: Normal appearance. She is not toxic-appearing or diaphoretic.  HENT:     Head: Normocephalic and atraumatic.  Pulmonary:     Effort: Pulmonary effort is normal. No respiratory distress.  Musculoskeletal:     Right lower leg: No edema.     Left lower leg: No edema.  Neurological:     General: No focal deficit present.     Mental Status: She is alert and oriented to person, place, and time.  Psychiatric:        Mood and Affect: Mood normal.        Behavior: Behavior normal.        Thought Content: Thought content normal.        Judgment: Judgment normal.     BP 131/87   Pulse 71   Temp 97.8 F (36.6 C) (Temporal)   Ht 5\' 9"  (1.753 m)   Wt 234 lb 4 oz (106.3 kg)   BMI 34.59 kg/m  Wt Readings from Last 3 Encounters:  06/24/20 234 lb 4 oz (  106.3 kg)  02/07/20 244 lb 4.8 oz (110.8 kg)  11/01/19 (!) 242 lb 11.2 oz (110.1 kg)     Health Maintenance Due  Topic Date Due  . Hepatitis C Screening  Never done  . COVID-19 Vaccine (1) Never done  . HIV Screening  Never done  . PAP SMEAR-Modifier  05/14/2016    There are no preventive care reminders to display for this patient.  Lab Results  Component Value Date   TSH 0.862 10/04/2019   Lab Results  Component Value Date   WBC 6.2 10/04/2019   HGB 13.7 10/04/2019   HCT 41.7 10/04/2019   MCV 93 10/04/2019   PLT 174 10/04/2019   Lab Results  Component Value Date   NA 136 10/01/2019   K 3.8 10/01/2019   CO2 25 10/01/2019   GLUCOSE 106 (H) 10/01/2019   BUN 9 10/01/2019   CREATININE 0.82 10/01/2019   BILITOT <0.2 05/22/2019   ALKPHOS 74 05/22/2019   AST 16 05/22/2019   ALT 15 05/22/2019   PROT 7.0 05/22/2019   ALBUMIN 4.2 05/22/2019   CALCIUM 8.4 (L) 10/01/2019   ANIONGAP 8 10/01/2019   No results found for: CHOL No  results found for: HDL No results found for: LDLCALC No results found for: TRIG No results found for: CHOLHDL No results found for: HENI7P    Assessment & Plan:   Linea was seen today for obesity.  Diagnoses and all orders for this visit:  Obesity (BMI 30.0-34.9) BMI 34. Discussed with patient that phentermine is controlled and that per our office policies controlled medications can only be prescribed by her PCP. Offered referral to weight management clinic, however patient declined by leaving exam room.   Follow-up: Return if symptoms worsen or fail to improve.    Gabriel Earing, FNP

## 2020-06-26 ENCOUNTER — Encounter: Payer: Self-pay | Admitting: Family

## 2020-06-26 ENCOUNTER — Ambulatory Visit (INDEPENDENT_AMBULATORY_CARE_PROVIDER_SITE_OTHER): Payer: BC Managed Care – PPO | Admitting: Family

## 2020-06-26 ENCOUNTER — Other Ambulatory Visit: Payer: Self-pay | Admitting: Family

## 2020-06-26 VITALS — Ht 69.0 in | Wt 233.0 lb

## 2020-06-26 DIAGNOSIS — E669 Obesity, unspecified: Secondary | ICD-10-CM

## 2020-06-26 DIAGNOSIS — Z713 Dietary counseling and surveillance: Secondary | ICD-10-CM | POA: Diagnosis not present

## 2020-06-26 MED ORDER — PHENTERMINE HCL 37.5 MG PO TABS: 37.5000 mg | ORAL_TABLET | Freq: Every day | ORAL | 2 refills | Status: DC

## 2020-06-26 NOTE — Patient Instructions (Signed)
Calorie Counting for Weight Loss Calories are units of energy. Your body needs a certain number of calories from food to keep going throughout the day. When you eat or drink more calories than your body needs, your body stores the extra calories mostly as fat. When you eat or drink fewer calories than your body needs, your body burns fat to get the energy it needs. Calorie counting means keeping track of how many calories you eat and drink each day. Calorie counting can be helpful if you need to lose weight. If you eat fewer calories than your body needs, you should lose weight. Ask your health care provider what a healthy weight is for you. For calorie counting to work, you will need to eat the right number of calories each day to lose a healthy amount of weight per week. A dietitian can help you figure out how many calories you need in a day and will suggest ways to reach your calorie goal.  A healthy amount of weight to lose each week is usually 1-2 lb (0.5-0.9 kg). This usually means that your daily calorie intake should be reduced by 500-750 calories.  Eating 1,200-1,500 calories a day can help most women lose weight.  Eating 1,500-1,800 calories a day can help most men lose weight. What do I need to know about calorie counting? Work with your health care provider or dietitian to determine how many calories you should get each day. To meet your daily calorie goal, you will need to:  Find out how many calories are in each food that you would like to eat. Try to do this before you eat.  Decide how much of the food you plan to eat.  Keep a food log. Do this by writing down what you ate and how many calories it had. To successfully lose weight, it is important to balance calorie counting with a healthy lifestyle that includes regular activity. Where do I find calorie information? The number of calories in a food can be found on a Nutrition Facts label. If a food does not have a Nutrition Facts  label, try to look up the calories online or ask your dietitian for help. Remember that calories are listed per serving. If you choose to have more than one serving of a food, you will have to multiply the calories per serving by the number of servings you plan to eat. For example, the label on a package of bread might say that a serving size is 1 slice and that there are 90 calories in a serving. If you eat 1 slice, you will have eaten 90 calories. If you eat 2 slices, you will have eaten 180 calories.   How do I keep a food log? After each time that you eat, record the following in your food log as soon as possible:  What you ate. Be sure to include toppings, sauces, and other extras on the food.  How much you ate. This can be measured in cups, ounces, or number of items.  How many calories were in each food and drink.  The total number of calories in the food you ate. Keep your food log near you, such as in a pocket-sized notebook or on an app or website on your mobile phone. Some programs will calculate calories for you and show you how many calories you have left to meet your daily goal. What are some portion-control tips?  Know how many calories are in a serving. This will   help you know how many servings you can have of a certain food.  Use a measuring cup to measure serving sizes. You could also try weighing out portions on a kitchen scale. With time, you will be able to estimate serving sizes for some foods.  Take time to put servings of different foods on your favorite plates or in your favorite bowls and cups so you know what a serving looks like.  Try not to eat straight from a food's packaging, such as from a bag or box. Eating straight from the package makes it hard to see how much you are eating and can lead to overeating. Put the amount you would like to eat in a cup or on a plate to make sure you are eating the right portion.  Use smaller plates, glasses, and bowls for smaller  portions and to prevent overeating.  Try not to multitask. For example, avoid watching TV or using your computer while eating. If it is time to eat, sit down at a table and enjoy your food. This will help you recognize when you are full. It will also help you be more mindful of what and how much you are eating. What are tips for following this plan? Reading food labels  Check the calorie count compared with the serving size. The serving size may be smaller than what you are used to eating.  Check the source of the calories. Try to choose foods that are high in protein, fiber, and vitamins, and low in saturated fat, trans fat, and sodium. Shopping  Read nutrition labels while you shop. This will help you make healthy decisions about which foods to buy.  Pay attention to nutrition labels for low-fat or fat-free foods. These foods sometimes have the same number of calories or more calories than the full-fat versions. They also often have added sugar, starch, or salt to make up for flavor that was removed with the fat.  Make a grocery list of lower-calorie foods and stick to it. Cooking  Try to cook your favorite foods in a healthier way. For example, try baking instead of frying.  Use low-fat dairy products. Meal planning  Use more fruits and vegetables. One-half of your plate should be fruits and vegetables.  Include lean proteins, such as chicken, turkey, and fish. Lifestyle Each week, aim to do one of the following:  150 minutes of moderate exercise, such as walking.  75 minutes of vigorous exercise, such as running. General information  Know how many calories are in the foods you eat most often. This will help you calculate calorie counts faster.  Find a way of tracking calories that works for you. Get creative. Try different apps or programs if writing down calories does not work for you. What foods should I eat?  Eat nutritious foods. It is better to have a nutritious,  high-calorie food, such as an avocado, than a food with few nutrients, such as a bag of potato chips.  Use your calories on foods and drinks that will fill you up and will not leave you hungry soon after eating. ? Examples of foods that fill you up are nuts and nut butters, vegetables, lean proteins, and high-fiber foods such as whole grains. High-fiber foods are foods with more than 5 g of fiber per serving.  Pay attention to calories in drinks. Low-calorie drinks include water and unsweetened drinks. The items listed above may not be a complete list of foods and beverages you can eat.   Contact a dietitian for more information.   What foods should I limit? Limit foods or drinks that are not good sources of vitamins, minerals, or protein or that are high in unhealthy fats. These include:  Candy.  Other sweets.  Sodas, specialty coffee drinks, alcohol, and juice. The items listed above may not be a complete list of foods and beverages you should avoid. Contact a dietitian for more information. How do I count calories when eating out?  Pay attention to portions. Often, portions are much larger when eating out. Try these tips to keep portions smaller: ? Consider sharing a meal instead of getting your own. ? If you get your own meal, eat only half of it. Before you start eating, ask for a container and put half of your meal into it. ? When available, consider ordering smaller portions from the menu instead of full portions.  Pay attention to your food and drink choices. Knowing the way food is cooked and what is included with the meal can help you eat fewer calories. ? If calories are listed on the menu, choose the lower-calorie options. ? Choose dishes that include vegetables, fruits, whole grains, low-fat dairy products, and lean proteins. ? Choose items that are boiled, broiled, grilled, or steamed. Avoid items that are buttered, battered, fried, or served with cream sauce. Items labeled as  crispy are usually fried, unless stated otherwise. ? Choose water, low-fat milk, unsweetened iced tea, or other drinks without added sugar. If you want an alcoholic beverage, choose a lower-calorie option, such as a glass of wine or light beer. ? Ask for dressings, sauces, and syrups on the side. These are usually high in calories, so you should limit the amount you eat. ? If you want a salad, choose a garden salad and ask for grilled meats. Avoid extra toppings such as bacon, cheese, or fried items. Ask for the dressing on the side, or ask for olive oil and vinegar or lemon to use as dressing.  Estimate how many servings of a food you are given. Knowing serving sizes will help you be aware of how much food you are eating at restaurants. Where to find more information  Centers for Disease Control and Prevention: www.cdc.gov  [place] Department of Agriculture: myplate.gov Summary  Calorie counting means keeping track of how many calories you eat and drink each day. If you eat fewer calories than your body needs, you should lose weight.  A healthy amount of weight to lose per week is usually 1-2 lb (0.5-0.9 kg). This usually means reducing your daily calorie intake by 500-750 calories.  The number of calories in a food can be found on a Nutrition Facts label. If a food does not have a Nutrition Facts label, try to look up the calories online or ask your dietitian for help.  Use smaller plates, glasses, and bowls for smaller portions and to prevent overeating.  Use your calories on foods and drinks that will fill you up and not leave you hungry shortly after a meal. This information is not intended to replace advice given to you by your health care provider. Make sure you discuss any questions you have with your health care provider. Document Revised: 05/10/2019 Document Reviewed: 05/10/2019 Elsevier Patient Education  2021 Elsevier Inc.  

## 2020-06-26 NOTE — Progress Notes (Addendum)
° °  Virtual Visit via  Note Due to COVID-19 pandemic this visit was conducted virtually. This visit type was conducted due to national recommendations for restrictions regarding the COVID-19 Pandemic (e.g. social distancing, sheltering in place) in an effort to limit this patient's exposure and mitigate transmission in our community. All issues noted in this document were discussed and addressed.  A physical exam was not performed with this format.  I connected with Kristi ANASTAS on 06/26/20 at 4:36 pm  by video  and verified that I am speaking with the correct person using two identifiers. Kristi Holmes is currently located at work and no one is currently with her during visit. The provider, Evelina Dun, FNP is located in their office at time of visit.  I discussed the limitations, risks, security and privacy concerns of performing an evaluation and management service by video and the availability of in person appointments. I also discussed with the patient that there may be a patient responsible charge related to this service. The patient expressed understanding and agreed to proceed.   History and Present Illness:  HPI Pt presents to the office today today to discuss weight loss medication. She has taken phentermine in the past without success, but states she did not have a good mind set. Wants to retry and will eat healthier.  She is currently walking 45 mins twice a day. Her current weight is 233 lbs.   She is currently taking suboxone.   Ht $R'5\' 9"'jF$  (1.753 m)    Wt 233 lb (105.7 kg)    BMI 34.41 kg/m   Review of Systems  All other systems reviewed and are negative.    Observations/Objective: No SOB or distress noted, patient looks well.   Assessment and Plan: 1. Encounter for weight loss counseling - CMP14+EGFR  2. Obesity (BMI 30-39.9) - CMP14+EGFR  Pt will come and get CMP  Encouraged healthy diet and exercise Force fluids RTO in 3 months     I discussed the  assessment and treatment plan with the patient. The patient was provided an opportunity to ask questions and all were answered. The patient agreed with the plan and demonstrated an understanding of the instructions.   The patient was advised to call back or seek an in-person evaluation if the symptoms worsen or if the condition fails to improve as anticipated.  The above assessment and management plan was discussed with the patient. The patient verbalized understanding of and has agreed to the management plan. Patient is aware to call the clinic if symptoms persist or worsen. Patient is aware when to return to the clinic for a follow-up visit. Patient educated on when it is appropriate to go to the emergency department.   Time call ended:  4:48 pm   I provided 11 minutes of face-to-face time during this encounter.    Evelina Dun, FNP

## 2020-06-26 NOTE — Telephone Encounter (Signed)
Left message to make video visit with christy today

## 2020-06-30 ENCOUNTER — Other Ambulatory Visit: Payer: Self-pay | Admitting: Family

## 2020-07-07 ENCOUNTER — Ambulatory Visit: Payer: BC Managed Care – PPO | Admitting: Family

## 2020-08-11 ENCOUNTER — Encounter: Payer: Self-pay | Admitting: Family Medicine

## 2020-10-03 ENCOUNTER — Other Ambulatory Visit: Payer: Self-pay

## 2020-10-03 ENCOUNTER — Ambulatory Visit: Payer: BC Managed Care – PPO | Admitting: Family

## 2020-10-03 ENCOUNTER — Encounter: Payer: Self-pay | Admitting: Family

## 2020-10-03 VITALS — BP 113/82 | HR 86 | Temp 98.1°F | Resp 20 | Ht 69.0 in | Wt 211.0 lb

## 2020-10-03 DIAGNOSIS — E669 Obesity, unspecified: Secondary | ICD-10-CM

## 2020-10-03 DIAGNOSIS — F17200 Nicotine dependence, unspecified, uncomplicated: Secondary | ICD-10-CM | POA: Diagnosis not present

## 2020-10-03 DIAGNOSIS — Z713 Dietary counseling and surveillance: Secondary | ICD-10-CM

## 2020-10-03 MED ORDER — PHENTERMINE HCL 37.5 MG PO TABS: 37.5000 mg | ORAL_TABLET | Freq: Every day | ORAL | 2 refills | Status: AC

## 2020-10-03 NOTE — Patient Instructions (Signed)

## 2020-10-03 NOTE — Progress Notes (Signed)
   Subjective:    Patient ID: Kristi Holmes, female    DOB: 09/07/1980, 39 y.o.   MRN: [id number]  Chief Complaint  Patient presents with   Medical Management of Chronic Issues    HPI Pt presents to the office today to discuss weight loss. She was seen on 06/26/20 for weight loss and we started her on phentermine. She has lost 22 lbs. She reports she is exercising daily. She reports she is trying to eat healthier.   She is kayaking most of days of the week.   She continues to smoke a pack a day.   Review of Systems  All other systems reviewed and are negative.     Objective:   Physical Exam Vitals reviewed.  Constitutional:      General: She is not in acute distress.    Appearance: She is well-developed. She is obese.  HENT:     Head: Normocephalic and atraumatic.     Right Ear: Tympanic membrane normal.     Left Ear: Tympanic membrane normal.  Eyes:     Pupils: Pupils are equal, round, and reactive to light.  Neck:     Thyroid: No thyromegaly.  Cardiovascular:     Rate and Rhythm: Normal rate and regular rhythm.     Heart sounds: Normal heart sounds. No murmur heard. Pulmonary:     Effort: Pulmonary effort is normal. No respiratory distress.     Breath sounds: Normal breath sounds. No wheezing.  Abdominal:     General: Bowel sounds are normal. There is no distension.     Palpations: Abdomen is soft.     Tenderness: There is no abdominal tenderness.  Musculoskeletal:        General: No tenderness. Normal range of motion.     Cervical back: Normal range of motion and neck supple.  Skin:    General: Skin is warm and dry.  Neurological:     Mental Status: She is alert and oriented to person, place, and time.     Cranial Nerves: No cranial nerve deficit.     Deep Tendon Reflexes: Reflexes are normal and symmetric.  Psychiatric:        Behavior: Behavior normal.        Thought Content: Thought content normal.        Judgment: Judgment normal.      BP 113/82    Pulse 86   Temp 98.1 F (36.7 C) (Temporal)   Resp 20   Ht 5' 9" (1.753 m)   Wt 211 lb (95.7 kg)   SpO2 97%   BMI 31.16 kg/m      Assessment & Plan:  Kristi Holmes comes in today with chief complaint of Medical Management of Chronic Issues   Diagnosis and orders addressed:  1. Current smoker - CMP14+EGFR  2. Weight loss counseling, encounter for Encouraged exercise and healthy diet Must continue to lose 5% of weight loss while on this medication - phentermine (ADIPEX-P) 37.5 MG tablet; Take 1 tablet (37.5 mg total) by mouth daily before breakfast.  Dispense: 30 tablet; Refill: 2 - CMP14+EGFR  3. Obesity (BMI 30-39.9) - phentermine (ADIPEX-P) 37.5 MG tablet; Take 1 tablet (37.5 mg total) by mouth daily before breakfast.  Dispense: 30 tablet; Refill: 2 - CMP14+EGFR   Labs pending Health Maintenance reviewed Diet and exercise encouraged  Follow up plan: 3 months    Christy Hawks, FNP    

## 2020-10-04 LAB — CMP14+EGFR
ALT: 11 IU/L (ref 0–32)
AST: 15 IU/L (ref 0–40)
Albumin/Globulin Ratio: 1.9 (ref 1.2–2.2)
Albumin: 4.3 g/dL (ref 3.8–4.8)
Alkaline Phosphatase: 60 IU/L (ref 44–121)
BUN/Creatinine Ratio: 13 (ref 9–23)
BUN: 12 mg/dL (ref 6–20)
Bilirubin Total: 0.2 mg/dL (ref 0.0–1.2)
CO2: 21 mmol/L (ref 20–29)
Calcium: 8.9 mg/dL (ref 8.7–10.2)
Chloride: 104 mmol/L (ref 96–106)
Creatinine, Ser: 0.92 mg/dL (ref 0.57–1.00)
Globulin, Total: 2.3 g/dL (ref 1.5–4.5)
Glucose: 91 mg/dL (ref 65–99)
Potassium: 4.6 mmol/L (ref 3.5–5.2)
Sodium: 138 mmol/L (ref 134–144)
Total Protein: 6.6 g/dL (ref 6.0–8.5)
eGFR: 81 mL/min/{1.73_m2} (ref >59)

## 2020-10-08 ENCOUNTER — Ambulatory Visit: Payer: BC Managed Care – PPO | Admitting: Family

## 2020-11-26 ENCOUNTER — Other Ambulatory Visit: Payer: Self-pay

## 2020-11-26 ENCOUNTER — Ambulatory Visit: Payer: BC Managed Care – PPO | Admitting: Nurse Practitioner

## 2020-11-26 ENCOUNTER — Encounter: Payer: Self-pay | Admitting: Nurse Practitioner

## 2020-11-26 VITALS — BP 142/94 | HR 77 | Temp 97.8°F | Ht 69.0 in | Wt 195.0 lb

## 2020-11-26 DIAGNOSIS — Z5181 Encounter for therapeutic drug level monitoring: Secondary | ICD-10-CM | POA: Diagnosis not present

## 2020-11-26 DIAGNOSIS — Z79891 Long term (current) use of opiate analgesic: Secondary | ICD-10-CM | POA: Insufficient documentation

## 2020-11-26 DIAGNOSIS — Z79899 Other long term (current) drug therapy: Secondary | ICD-10-CM | POA: Diagnosis not present

## 2020-11-26 NOTE — Patient Instructions (Signed)
Substance Abuse Testing Why am I having this test? Substance abuse testing is done to identify the presence of drugs in the body. You may have this test to measure the levels of certain medicines or illegaldrugs in your body. Substance abuse testing is most often used by employers and Patent examiner agencies to identify whether a person has used illegal drugs. You may also havethis test if you are involved in an accident at work. What is being tested? A substance abuse test may check for: Medicines that you have been prescribed, such as pain medicine or ADHD medicine. Illegal drugs, such as heroin, cocaine, amphetamines, and marijuana. What kind of sample is taken?        Your health care provider may collect one or more of the following to perform the test: A urine sample. A test sample is collected by passing urine into a clean cup. A hair sample. This requires cutting a collection of hair from your head or body that is about the width of a pencil. Hair may be taken from your head, chest, underarms, legs, or face. A blood sample. This is usually collected by inserting a needle into a blood vessel. How do I prepare for this test? You may be asked to provide a list of your current prescription medicines. If the test is required for employment or legal reasons, you will be asked to give permission (consent) for the test. Before providing a sample, you may be asked to put all of your belongings in alocker or other safe location, and you may be asked to wash your hands Follow the specific directions of the lab or department that is doing the test. Tell a health care provider about: Any allergies you have. All medicines you are taking, including vitamins, herbs, eye drops, creams, and over-the-counter medicines. Any blood disorders you have. Any surgeries you have had. Any medical conditions you have. Whether you are pregnant or may be pregnant. How are the results reported? Your test  results will be reported as either positive or negative. What do the results mean? A negative test result means that no drugs or medicines were found in thesample that you provided. A positive test result may mean that you have recently used drugs or taken a medicine. If your result is positive, more testing will be done to confirm thepresence of drugs. Talk with your health care provider or the department that is doing the testabout what your results mean. Questions to ask your health care provider Ask your health care provider, or the department that is doing the test: When will my results be ready? How will I get my results? What are my treatment options? What other tests do I need? What are my next steps? Summary Substance abuse testing is done to identify the presence of drugs in the body. Substance abuse testing is most often used by employers and Patent examiner agencies to identify whether a person has used illegal drugs. You may also have this test if you are involved in an accident at work. You may be asked to provide a list of your current prescription medicines. If the test is required for employment or legal reasons, you will be asked to give permission (consent) for the test. This information is not intended to replace advice given to you by your health care provider. Make sure you discuss any questions you have with your healthcare provider. Document Revised: 02/08/2020 Document Reviewed: 02/08/2020 Elsevier Patient Education  2022 [employer].

## 2020-11-26 NOTE — Assessment & Plan Note (Signed)
Urine drug screen completed results pending.

## 2020-11-26 NOTE — Addendum Note (Signed)
Addended by: Daryll Drown on: 11/26/2020 04:54 PM   Modules accepted: Orders

## 2020-11-26 NOTE — Progress Notes (Signed)
Acute Office Visit  Subjective:    Patient ID: Kristi Holmes, female    DOB: 05-08-1980, 40 y.o.   MRN: [id number]  Chief Complaint  Patient presents with   Labs Only    HPI Patient is in today for to complete a urine drug screen for Suboxone.  No other symptoms.  Past Medical History:  Diagnosis Date   Chronic leg pain    Chronic TMJ pain    Varicose veins     Past Surgical History:  Procedure Laterality Date   FINGER SURGERY     TUBAL LIGATION      No family history on file.  Social History   Socioeconomic History   Marital status: Married    Spouse name: Not on file   Number of children: 2   Years of education: Not on file   Highest education level: Not on file  Occupational History   Not on file  Tobacco Use   Smoking status: Every Day    Packs/day: 1.00    Types: Cigarettes   Smokeless tobacco: Never  Substance and Sexual Activity   Alcohol use: No    Alcohol/week: 0.0 standard drinks   Drug use: No   Sexual activity: Yes  Other Topics Concern   Not on file  Social History Narrative   Not on file   Social Determinants of Health   Financial Resource Strain: Not on file  Food Insecurity: Not on file  Transportation Needs: Not on file  Physical Activity: Not on file  Stress: Not on file  Social Connections: Not on file  Intimate Partner Violence: Not on file    Outpatient Medications Prior to Visit  Medication Sig Dispense Refill   Buprenorphine HCl-Naloxone HCl 8-2 MG FILM Place under the tongue daily.     phentermine (ADIPEX-P) 37.5 MG tablet Take 1 tablet (37.5 mg total) by mouth daily before breakfast. 30 tablet 2   No facility-administered medications prior to visit.    No Known Allergies  Review of Systems  Constitutional: Negative.   HENT: Negative.    Eyes: Negative.   Respiratory: Negative.    Gastrointestinal: Negative.   Genitourinary: Negative.   Skin: Negative.   All other systems reviewed and are negative.      Objective:    Physical Exam Vitals and nursing note reviewed.  Constitutional:      Appearance: Normal appearance.  HENT:     Head: Normocephalic.     Nose: Nose normal.     Mouth/Throat:     Mouth: Mucous membranes are moist.     Pharynx: Oropharynx is clear.  Eyes:     Conjunctiva/sclera: Conjunctivae normal.  Cardiovascular:     Rate and Rhythm: Normal rate and regular rhythm.  Pulmonary:     Effort: Pulmonary effort is normal.     Breath sounds: Normal breath sounds.  Musculoskeletal:        General: Normal range of motion.  Neurological:     Mental Status: She is alert.    BP (!) 142/94   Pulse 77   Temp 97.8 F (36.6 C) (Temporal)   Ht $R'5\' 9"'sH$  (1.753 m)   Wt 195 lb (88.5 kg)   SpO2 97%   BMI 28.80 kg/m  Wt Readings from Last 3 Encounters:  11/26/20 195 lb (88.5 kg)  10/03/20 211 lb (95.7 kg)  06/26/20 233 lb (105.7 kg)    Health Maintenance Due  Topic Date Due   COVID-19 Vaccine (1) Never done  HIV Screening  Never done   Hepatitis C Screening  Never done   INFLUENZA VACCINE  11/10/2020    There are no preventive care reminders to display for this patient.   Lab Results  Component Value Date   TSH 0.862 10/04/2019   Lab Results  Component Value Date   WBC 6.2 10/04/2019   HGB 13.7 10/04/2019   HCT 41.7 10/04/2019   MCV 93 10/04/2019   PLT 174 10/04/2019   Lab Results  Component Value Date   NA 138 10/03/2020   K 4.6 10/03/2020   CO2 21 10/03/2020   GLUCOSE 91 10/03/2020   BUN 12 10/03/2020   CREATININE 0.92 10/03/2020   BILITOT 0.2 10/03/2020   ALKPHOS 60 10/03/2020   AST 15 10/03/2020   ALT 11 10/03/2020   PROT 6.6 10/03/2020   ALBUMIN 4.3 10/03/2020   CALCIUM 8.9 10/03/2020   ANIONGAP 8 10/01/2019   EGFR 81 10/03/2020        Assessment & Plan:   Problem List Items Addressed This Visit       Other   Encounter for monitoring Suboxone maintenance therapy - Primary    Urine drug screen completed results pending.       Relevant Orders   Drug Screen, Urine     No orders of the defined types were placed in this encounter.    Ivy Lynn, NP

## 2020-11-26 NOTE — Addendum Note (Signed)
Addended by: Daryll Drown on: 11/26/2020 04:07 PM   Modules accepted: Orders

## 2020-11-29 LAB — TOXASSURE SELECT 13 (MW), URINE

## 2020-12-06 LAB — BUPRENORPHINE, URINE: Buprenorphine, Urine: POSITIVE — AB

## 2021-03-04 IMAGING — DX DG CHEST 1V PORT
1 series · 1 of 1 positions shown · non-contrast
Comparison: None.

CLINICAL DATA: 38-year-old with bilateral lower extremity swelling.
Evaluate for cardiomegaly.

EXAM:
PORTABLE CHEST 1 VIEW

[chest ap]
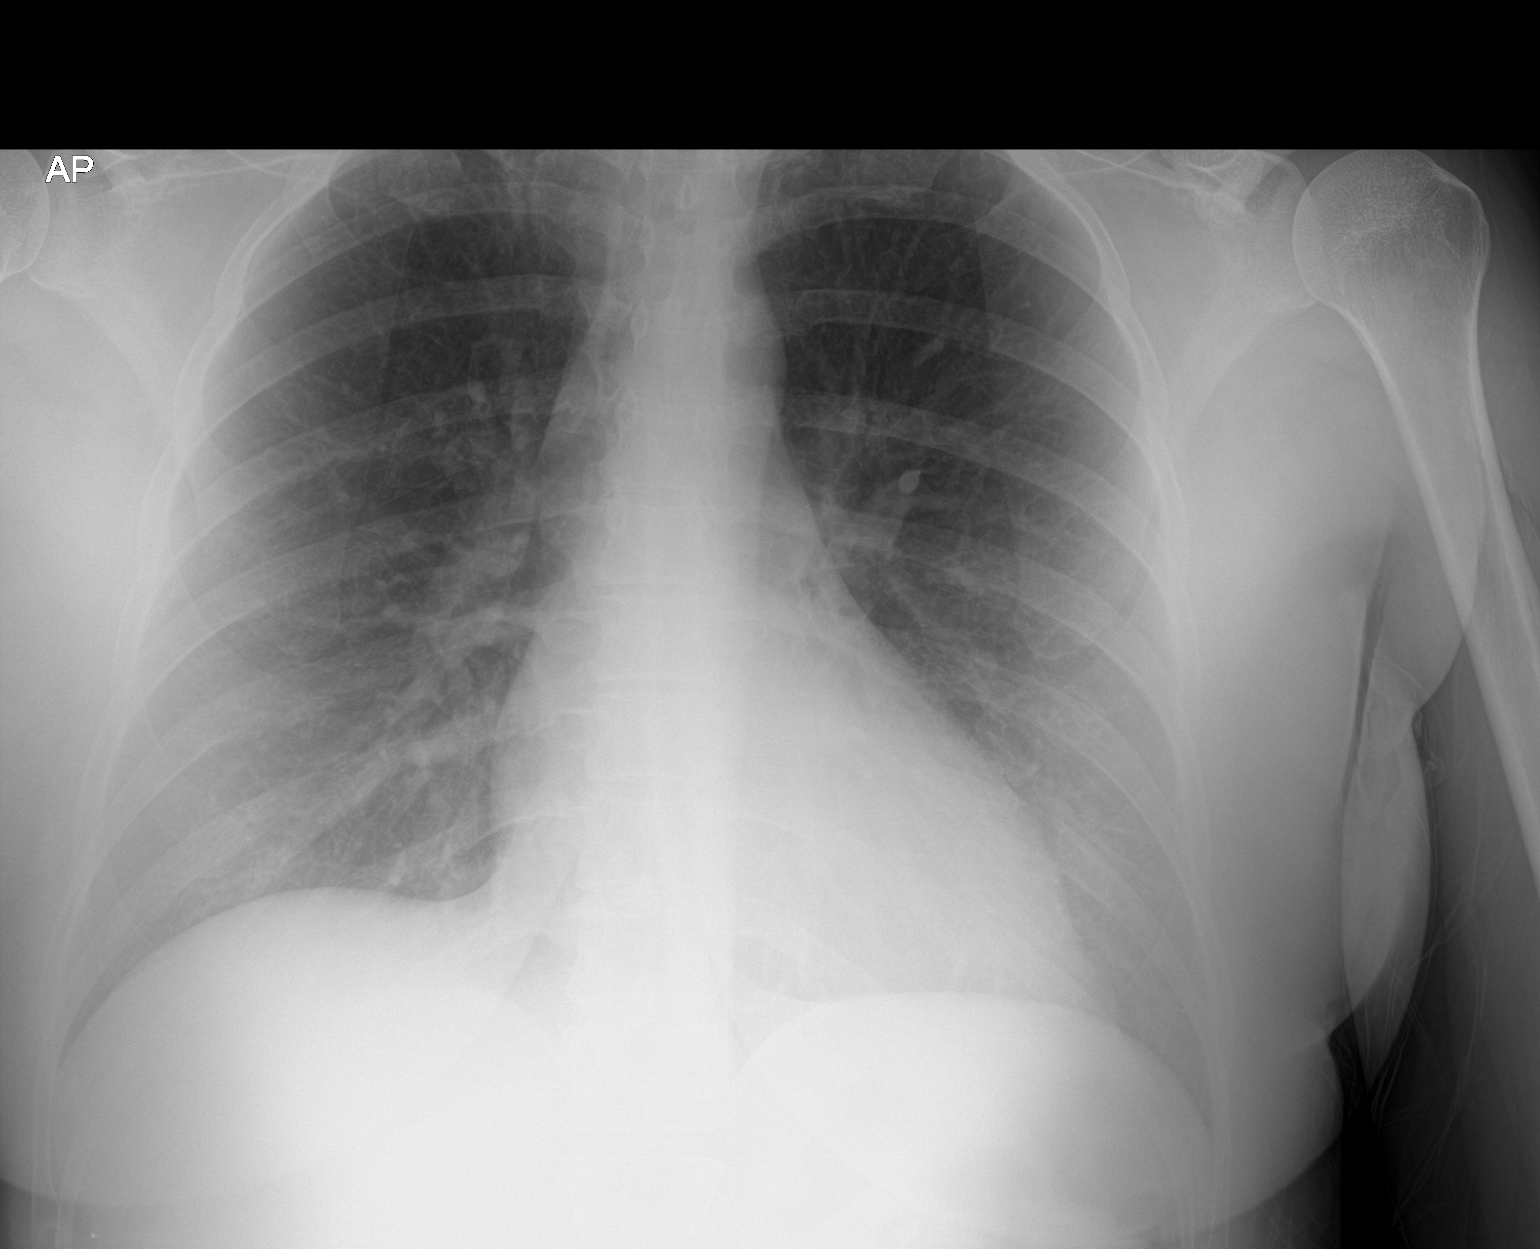

[1 of 1 positions shown; findings below may reference images not displayed]

FINDINGS: Lungs are clear. Heart and mediastinum are within normal limits.
Trachea is midline. Negative for a pneumothorax. Density in the
upper mediastinum near the right clavicle head is likely related to
the sternum but has an unusual configuration.
IMPRESSION: No acute chest abnormality.  No evidence for cardiomegaly.

Density in the upper mediastinum is presumably related to the
sternum. Configuration is slightly unusual and consider clinical
correlation in this area. Consider further characterization with a
PA and lateral view of the chest.

## 2023-03-18 ENCOUNTER — Ambulatory Visit: Payer: Self-pay
